# Patient Record
Sex: Female | Born: 1950 | Race: Black or African American | Hispanic: No | Smoking: Former smoker
Health system: Southern US, Community
[De-identification: ages and names within clinical notes are randomized; demographics above are authoritative.]

## PROBLEM LIST (undated history)

## (undated) DIAGNOSIS — K069 Disorder of gingiva and edentulous alveolar ridge, unspecified: Secondary | ICD-10-CM

## (undated) DIAGNOSIS — G709 Myoneural disorder, unspecified: Secondary | ICD-10-CM

## (undated) DIAGNOSIS — G2581 Restless legs syndrome: Secondary | ICD-10-CM

## (undated) DIAGNOSIS — M1990 Unspecified osteoarthritis, unspecified site: Secondary | ICD-10-CM

## (undated) DIAGNOSIS — I10 Essential (primary) hypertension: Secondary | ICD-10-CM

## (undated) DIAGNOSIS — F419 Anxiety disorder, unspecified: Secondary | ICD-10-CM

## (undated) HISTORY — PX: COLONOSCOPY: SHX174

## (undated) HISTORY — PX: TONSILLECTOMY: SUR1361

## (undated) HISTORY — PX: EYE SURGERY: SHX253

## (undated) HISTORY — PX: BREAST SURGERY: SHX581

---

## 1998-10-31 ENCOUNTER — Other Ambulatory Visit: Admission: RE | Admit: 1998-10-31 | Discharge: 1998-10-31 | Payer: Self-pay | Admitting: Obstetrics and Gynecology

## 2001-02-07 ENCOUNTER — Other Ambulatory Visit: Admission: RE | Admit: 2001-02-07 | Discharge: 2001-02-07 | Payer: Self-pay | Admitting: Obstetrics and Gynecology

## 2007-02-03 ENCOUNTER — Ambulatory Visit: Payer: Self-pay | Admitting: Orthopedic Surgery

## 2007-02-03 DIAGNOSIS — M19049 Primary osteoarthritis, unspecified hand: Secondary | ICD-10-CM | POA: Insufficient documentation

## 2007-02-03 DIAGNOSIS — M549 Dorsalgia, unspecified: Secondary | ICD-10-CM | POA: Insufficient documentation

## 2007-02-03 DIAGNOSIS — M25569 Pain in unspecified knee: Secondary | ICD-10-CM

## 2014-08-05 ENCOUNTER — Other Ambulatory Visit: Payer: Self-pay | Admitting: Obstetrics and Gynecology

## 2014-08-06 ENCOUNTER — Encounter (HOSPITAL_COMMUNITY)
Admission: RE | Admit: 2014-08-06 | Discharge: 2014-08-06 | Disposition: A | Discharge status: Home / Self Care | Payer: BC Managed Care – PPO | Source: Ambulatory Visit | Attending: Obstetrics and Gynecology | Admitting: Obstetrics and Gynecology

## 2014-08-06 ENCOUNTER — Other Ambulatory Visit: Payer: Self-pay

## 2014-08-06 ENCOUNTER — Encounter (HOSPITAL_COMMUNITY): Payer: Self-pay

## 2014-08-06 DIAGNOSIS — Z01818 Encounter for other preprocedural examination: Secondary | ICD-10-CM | POA: Diagnosis present

## 2014-08-06 HISTORY — DX: Anxiety disorder, unspecified: F41.9

## 2014-08-06 HISTORY — DX: Disorder of gingiva and edentulous alveolar ridge, unspecified: K06.9

## 2014-08-06 HISTORY — DX: Essential (primary) hypertension: I10

## 2014-08-06 HISTORY — DX: Myoneural disorder, unspecified: G70.9

## 2014-08-06 HISTORY — DX: Unspecified osteoarthritis, unspecified site: M19.90

## 2014-08-06 HISTORY — DX: Restless legs syndrome: G25.81

## 2014-08-06 LAB — BASIC METABOLIC PANEL
ANION GAP: 7 (ref 5–15)
BUN: 30 mg/dL — ABNORMAL HIGH (ref 6–20)
CALCIUM: 9.1 mg/dL (ref 8.9–10.3)
CO2: 28 mmol/L (ref 22–32)
Chloride: 104 mmol/L (ref 101–111)
Creatinine, Ser: 1.35 mg/dL — ABNORMAL HIGH (ref 0.44–1.00)
GFR, EST AFRICAN AMERICAN: 47 mL/min — AB (ref >60)
GFR, EST NON AFRICAN AMERICAN: 41 mL/min — AB (ref >60)
Glucose, Bld: 114 mg/dL — ABNORMAL HIGH (ref 65–99)
POTASSIUM: 3.9 mmol/L (ref 3.5–5.1)
SODIUM: 139 mmol/L (ref 135–145)

## 2014-08-06 LAB — CBC
HCT: 37.9 % (ref 36.0–46.0)
HEMOGLOBIN: 12.6 g/dL (ref 12.0–15.0)
MCH: 30.4 pg (ref 26.0–34.0)
MCHC: 33.2 g/dL (ref 30.0–36.0)
MCV: 91.3 fL (ref 78.0–100.0)
Platelets: 224 10*3/uL (ref 150–400)
RBC: 4.15 MIL/uL (ref 3.87–5.11)
RDW: 13 % (ref 11.5–15.5)
WBC: 5.3 10*3/uL (ref 4.0–10.5)

## 2014-08-06 NOTE — Patient Instructions (Addendum)
   Your procedure is scheduled on:  Monday, June 27  Enter through the Main Entrance of [facility] at: 11:30 AM Pick up the phone at the desk and dial [phone] and inform us of your arrival.  Please call this number if you have any problems the morning of surgery: [phone]  Remember: Do not eat food after midnight: Sunday Do not drink clear liquids after: 9 AM Monday, day of surgery Take these medicines the morning of surgery with a SIP OF WATER:  maxide and valium if needed  Do not wear jewelry, make-up, or FINGER nail polish No metal in your hair or on your body. Do not wear lotions, powders, perfumes.  You may wear deodorant.  Do not bring valuables to the hospital. Contacts, dentures or bridgework may not be worn into surgery.  Leave suitcase in the car. After Surgery it may be brought to your room. For patients being admitted to the hospital, checkout time is 11:00am the day of discharge.  Home with daughter [name] cell [phone]

## 2014-08-15 NOTE — H&P (Signed)
NAMEJOSALYN, [name]            ACCOUNT NO.:  [id number]  MEDICAL RECORD NO.:  [id number]  LOCATION:  [facility]                           FACILITY:  [facility]  PHYSICIAN:  Lovenia Kim, M.D.DATE OF BIRTH:  1950-09-08  DATE OF ADMISSION:  08/06/2014 DATE OF DISCHARGE:  08/06/2014                             HISTORY & PHYSICAL   PREOPERATIVE DIAGNOSIS:  Symptomatic pelvic prolapse.  HISTORY OF PRESENT ILLNESS:  A 64 year old African American female, G1, P1 with symptomatic pelvic organ prolapse with uterine prolapse, possible cystocele, rectocele and possible menorrhagia.  ALLERGIES:  She has allergies to codeine, penicillin, and sulfa.  SOCIAL HISTORY:  She is a nonsmoker, nondrinker.  She denies domestic physical violence.  She has a history of vaginal delivery x1.  SURGICAL HISTORY:  She has a surgical history remarkable for tonsillectomy.  MEDICATIONS:  Include meloxicam p.r.n., Zyrtec p.r.n.,HCTZ, Vistaril as needed, diazepam as needed, multivitamin, fish oil, and soy.  PHYSICAL EXAMINATION:  GENERAL:  She is a well-developed, well- nourished, African American female, in no acute distress. HEENT:  Normal. NECK:  Supple.  Full range of motion. LUNGS:  Clear. HEART:  Regular rate and rhythm. ABDOMEN:  Soft, nontender. PELVIC:  Bulky retroflexed uterus and no adnexal masses.  Grade 1-2 cervical prolapse, cervix noted at the os without Valsalva. EXTREMITIES:  Reveal no cords. NEUROLOGIC:  Nonfocal. SKIN:  Intact.  IMPRESSION:  Symptomatic uterine prolapse with no evidence of urinary incontinence.  PLAN:  Proceed with TVH, anterior-posterior colporrhaphy, possible enterocele repair, risks of anesthesia, infection, bleeding, injury to surrounding organs with possible need for repair was discussed, delayed versus immediate complications to include bowel and bladder injury discussed.  The patient acknowledges and wishes to proceed.  Please note, the patient had a normal   endometrial biopsy done preoperatively.     Lovenia Kim, M.D.     RJT/MEDQ  D:  08/15/2014  T:  08/15/2014  Job:  [id number]

## 2014-08-15 NOTE — H&P (Deleted)
[name], [name]            ACCOUNT NO.:  [id number]  MEDICAL RECORD NO.:  [id number]  LOCATION:  [place]                           FACILITY:  [facility]  PHYSICIAN:  Lovenia Kim, M.D.DATE OF BIRTH:  08-05-1950  DATE OF ADMISSION:  08/06/2014 DATE OF DISCHARGE:  08/06/2014                             HISTORY & PHYSICAL   PREOPERATIVE DIAGNOSIS:  Symptomatic pelvic prolapse.  HISTORY OF PRESENT ILLNESS:  A 64 year old African American female, G1, P1 with symptomatic pelvic organ prolapse with uterine prolapse, possible cystocele, rectocele and possible menorrhagia.  ALLERGIES:  She has allergies to codeine, penicillin, and sulfa.  SOCIAL HISTORY:  She is a nonsmoker, nondrinker.  She denies domestic physical violence.  She has a history of vaginal delivery x1.  SURGICAL HISTORY:  She has a surgical history remarkable for tonsillectomy.  MEDICATIONS:  Include meloxicam p.r.n., Zyrtec p.r.n.,HCTZ, Vistaril as needed, diazepam as needed, multivitamin, fish oil, and soy.  PHYSICAL EXAMINATION:  GENERAL:  She is a well-developed, well- nourished, African American female, in no acute distress. HEENT:  Normal. NECK:  Supple.  Full range of motion. LUNGS:  Clear. HEART:  Regular rate and rhythm. ABDOMEN:  Soft, nontender. PELVIC:  Bulky retroflexed uterus and no adnexal masses.  Grade 1-2 cervical prolapse, cervix noted at the os without Valsalva. EXTREMITIES:  Reveal no cords. NEUROLOGIC:  Nonfocal. SKIN:  Intact.  IMPRESSION:  Symptomatic uterine prolapse with no evidence of urinary incontinence.  PLAN:  Proceed with TVH, anterior-posterior colporrhaphy, possible enterocele repair, risks of anesthesia, infection, bleeding, injury to surrounding organs with possible need for repair was discussed, delayed versus immediate complications to include bowel and bladder injury discussed.  The patient acknowledges and wishes to proceed.  Please note, the patient had a normal   endometrial biopsy done preoperatively.     Lovenia Kim, M.D.     RJT/MEDQ  D:  08/15/2014  T:  08/15/2014  Job:  [id number]

## 2014-08-16 ENCOUNTER — Encounter (HOSPITAL_COMMUNITY)
Admission: RE | Disposition: A | Discharge status: Home / Self Care | Payer: Self-pay | Source: Ambulatory Visit | Attending: Obstetrics and Gynecology

## 2014-08-16 ENCOUNTER — Ambulatory Visit (HOSPITAL_COMMUNITY): Payer: BC Managed Care – PPO | Admitting: Anesthesiology

## 2014-08-16 ENCOUNTER — Ambulatory Visit (HOSPITAL_COMMUNITY)
Admission: RE | Admit: 2014-08-16 | Discharge: 2014-08-17 | Disposition: A | Discharge status: Home / Self Care | Payer: BC Managed Care – PPO | Source: Ambulatory Visit | Attending: Obstetrics and Gynecology | Admitting: Obstetrics and Gynecology

## 2014-08-16 ENCOUNTER — Encounter (HOSPITAL_COMMUNITY): Payer: Self-pay | Admitting: *Deleted

## 2014-08-16 DIAGNOSIS — N8189 Other female genital prolapse: Secondary | ICD-10-CM | POA: Diagnosis present

## 2014-08-16 DIAGNOSIS — F419 Anxiety disorder, unspecified: Secondary | ICD-10-CM | POA: Diagnosis not present

## 2014-08-16 DIAGNOSIS — D259 Leiomyoma of uterus, unspecified: Secondary | ICD-10-CM | POA: Diagnosis not present

## 2014-08-16 DIAGNOSIS — M1990 Unspecified osteoarthritis, unspecified site: Secondary | ICD-10-CM | POA: Insufficient documentation

## 2014-08-16 DIAGNOSIS — I10 Essential (primary) hypertension: Secondary | ICD-10-CM | POA: Insufficient documentation

## 2014-08-16 DIAGNOSIS — N814 Uterovaginal prolapse, unspecified: Secondary | ICD-10-CM | POA: Diagnosis not present

## 2014-08-16 DIAGNOSIS — Z87891 Personal history of nicotine dependence: Secondary | ICD-10-CM | POA: Insufficient documentation

## 2014-08-16 DIAGNOSIS — Z880 Allergy status to penicillin: Secondary | ICD-10-CM | POA: Diagnosis not present

## 2014-08-16 DIAGNOSIS — N840 Polyp of corpus uteri: Secondary | ICD-10-CM | POA: Diagnosis not present

## 2014-08-16 DIAGNOSIS — Z882 Allergy status to sulfonamides status: Secondary | ICD-10-CM | POA: Diagnosis not present

## 2014-08-16 HISTORY — PX: BILATERAL SALPINGECTOMY: SHX5743

## 2014-08-16 HISTORY — PX: VAGINAL HYSTERECTOMY: SHX2639

## 2014-08-16 HISTORY — PX: ANTERIOR AND POSTERIOR REPAIR: SHX5121

## 2014-08-16 SURGERY — HYSTERECTOMY, VAGINAL
Anesthesia: General

## 2014-08-16 MED ORDER — FENTANYL CITRATE (PF) 100 MCG/2ML IJ SOLN
INTRAMUSCULAR | Status: DC | PRN
  Administered 2014-08-16 (×5): 50 ug via INTRAVENOUS

## 2014-08-16 MED ORDER — SODIUM CHLORIDE 0.9 % IJ SOLN
INTRAMUSCULAR | Status: AC
  Filled 2014-08-16: qty 50

## 2014-08-16 MED ORDER — PROPOFOL 10 MG/ML IV BOLUS
INTRAVENOUS | Status: AC
  Filled 2014-08-16: qty 20

## 2014-08-16 MED ORDER — MIDAZOLAM HCL 2 MG/2ML IJ SOLN
INTRAMUSCULAR | Status: AC
  Filled 2014-08-16: qty 2

## 2014-08-16 MED ORDER — NALOXONE HCL 0.4 MG/ML IJ SOLN: 0.4000 mg | INTRAMUSCULAR | Status: DC | PRN

## 2014-08-16 MED ORDER — VASOPRESSIN 20 UNIT/ML IV SOLN
INTRAVENOUS | Status: AC
  Filled 2014-08-16: qty 1

## 2014-08-16 MED ORDER — HYDROMORPHONE 0.3 MG/ML IV SOLN
INTRAVENOUS | Status: DC
  Administered 2014-08-16: 18:00:00 via INTRAVENOUS
  Administered 2014-08-16: 1.13 mg via INTRAVENOUS
  Administered 2014-08-17: 0.399 mg via INTRAVENOUS
  Filled 2014-08-16: qty 25

## 2014-08-16 MED ORDER — DEXTROSE IN LACTATED RINGERS 5 % IV SOLN
INTRAVENOUS | Status: DC
  Administered 2014-08-16: 23:00:00 via INTRAVENOUS

## 2014-08-16 MED ORDER — ESTRADIOL 0.1 MG/GM VA CREA
TOPICAL_CREAM | VAGINAL | Status: AC
  Filled 2014-08-16: qty 42.5

## 2014-08-16 MED ORDER — ONDANSETRON HCL 4 MG/2ML IJ SOLN: 4.0000 mg | Freq: Four times a day (QID) | INTRAMUSCULAR | Status: DC | PRN

## 2014-08-16 MED ORDER — HYDROMORPHONE HCL 1 MG/ML IJ SOLN
INTRAMUSCULAR | Status: DC | PRN
  Administered 2014-08-16: 1 mg via INTRAVENOUS

## 2014-08-16 MED ORDER — DIPHENHYDRAMINE HCL 12.5 MG/5ML PO ELIX: 12.5000 mg | ORAL_SOLUTION | Freq: Four times a day (QID) | ORAL | Status: DC | PRN

## 2014-08-16 MED ORDER — FENTANYL CITRATE (PF) 100 MCG/2ML IJ SOLN
25.0000 ug | INTRAMUSCULAR | Status: DC | PRN
  Administered 2014-08-16 (×3): 50 ug via INTRAVENOUS

## 2014-08-16 MED ORDER — DEXAMETHASONE SODIUM PHOSPHATE 10 MG/ML IJ SOLN
INTRAMUSCULAR | Status: DC | PRN
Start: 2014-08-16 — End: 2014-08-16
  Administered 2014-08-16: 10 mg via INTRAVENOUS

## 2014-08-16 MED ORDER — HYDROMORPHONE HCL 1 MG/ML IJ SOLN
INTRAMUSCULAR | Status: AC
Start: 2014-08-16 — End: 2014-08-16
  Filled 2014-08-16: qty 1

## 2014-08-16 MED ORDER — LIDOCAINE HCL (CARDIAC) 20 MG/ML IV SOLN
INTRAVENOUS | Status: DC | PRN
  Administered 2014-08-16: 60 mg via INTRAVENOUS

## 2014-08-16 MED ORDER — ONDANSETRON HCL 4 MG/2ML IJ SOLN
INTRAMUSCULAR | Status: AC
Start: 2014-08-16 — End: 2014-08-16
  Filled 2014-08-16: qty 2

## 2014-08-16 MED ORDER — OXYCODONE-ACETAMINOPHEN 5-325 MG PO TABS: 1.0000 | ORAL_TABLET | ORAL | Status: DC | PRN

## 2014-08-16 MED ORDER — DIPHENHYDRAMINE HCL 50 MG/ML IJ SOLN: 12.5000 mg | Freq: Four times a day (QID) | INTRAMUSCULAR | Status: DC | PRN

## 2014-08-16 MED ORDER — LACTATED RINGERS IV SOLN
INTRAVENOUS | Status: DC
  Administered 2014-08-16 (×3): via INTRAVENOUS

## 2014-08-16 MED ORDER — FENTANYL CITRATE (PF) 100 MCG/2ML IJ SOLN
INTRAMUSCULAR | Status: AC
  Filled 2014-08-16: qty 2

## 2014-08-16 MED ORDER — VASOPRESSIN 20 UNIT/ML IV SOLN
INTRAVENOUS | Status: DC | PRN
  Administered 2014-08-16: 20 mL via INTRAMUSCULAR

## 2014-08-16 MED ORDER — TRIAMTERENE-HCTZ 37.5-25 MG PO TABS
1.0000 | ORAL_TABLET | Freq: Every day | ORAL | Status: DC
  Filled 2014-08-16: qty 1

## 2014-08-16 MED ORDER — CEFAZOLIN SODIUM-DEXTROSE 2-3 GM-% IV SOLR
2.0000 g | INTRAVENOUS | Status: AC
  Administered 2014-08-16: 2 g via INTRAVENOUS

## 2014-08-16 MED ORDER — CEFAZOLIN SODIUM-DEXTROSE 2-3 GM-% IV SOLR
INTRAVENOUS | Status: AC
  Filled 2014-08-16: qty 50

## 2014-08-16 MED ORDER — ESTRADIOL 0.1 MG/GM VA CREA
TOPICAL_CREAM | VAGINAL | Status: DC | PRN
  Administered 2014-08-16: 1 via VAGINAL

## 2014-08-16 MED ORDER — TRAMADOL HCL 50 MG PO TABS: 50.0000 mg | ORAL_TABLET | Freq: Four times a day (QID) | ORAL | Status: DC | PRN

## 2014-08-16 MED ORDER — ACETAMINOPHEN 10 MG/ML IV SOLN
1000.0000 mg | Freq: Once | INTRAVENOUS | Status: AC
  Administered 2014-08-16: 1000 mg via INTRAVENOUS
  Filled 2014-08-16: qty 100

## 2014-08-16 MED ORDER — GLYCOPYRROLATE 0.2 MG/ML IJ SOLN
INTRAMUSCULAR | Status: DC | PRN
  Administered 2014-08-16: .4 mg via INTRAVENOUS
  Administered 2014-08-16 (×2): .1 mg via INTRAVENOUS

## 2014-08-16 MED ORDER — LIDOCAINE HCL (CARDIAC) 20 MG/ML IV SOLN
INTRAVENOUS | Status: AC
  Filled 2014-08-16: qty 5

## 2014-08-16 MED ORDER — MIDAZOLAM HCL 2 MG/2ML IJ SOLN
INTRAMUSCULAR | Status: DC | PRN
  Administered 2014-08-16: .5 mg via INTRAVENOUS
  Administered 2014-08-16: 1.5 mg via INTRAVENOUS

## 2014-08-16 MED ORDER — ONDANSETRON HCL 4 MG/2ML IJ SOLN: 4.0000 mg | Freq: Once | INTRAMUSCULAR | Status: DC | PRN

## 2014-08-16 MED ORDER — GLYCOPYRROLATE 0.2 MG/ML IJ SOLN
INTRAMUSCULAR | Status: AC
  Filled 2014-08-16: qty 3

## 2014-08-16 MED ORDER — SODIUM CHLORIDE 0.9 % IJ SOLN: 9.0000 mL | INTRAMUSCULAR | Status: DC | PRN

## 2014-08-16 MED ORDER — ROCURONIUM BROMIDE 100 MG/10ML IV SOLN
INTRAVENOUS | Status: DC | PRN
  Administered 2014-08-16: 40 mg via INTRAVENOUS

## 2014-08-16 MED ORDER — LABETALOL HCL 5 MG/ML IV SOLN
INTRAVENOUS | Status: DC | PRN
  Administered 2014-08-16: 10 mg via INTRAVENOUS

## 2014-08-16 MED ORDER — DEXAMETHASONE SODIUM PHOSPHATE 10 MG/ML IJ SOLN
INTRAMUSCULAR | Status: AC
  Filled 2014-08-16: qty 1

## 2014-08-16 MED ORDER — ROCURONIUM BROMIDE 100 MG/10ML IV SOLN
INTRAVENOUS | Status: AC
  Filled 2014-08-16: qty 1

## 2014-08-16 MED ORDER — LABETALOL HCL 5 MG/ML IV SOLN
INTRAVENOUS | Status: AC
Start: 2014-08-16 — End: 2014-08-16
  Filled 2014-08-16: qty 4

## 2014-08-16 MED ORDER — NEOSTIGMINE METHYLSULFATE 10 MG/10ML IV SOLN
INTRAVENOUS | Status: DC | PRN
  Administered 2014-08-16: 3 mg via INTRAVENOUS

## 2014-08-16 MED ORDER — ONDANSETRON HCL 4 MG/2ML IJ SOLN
INTRAMUSCULAR | Status: DC | PRN
  Administered 2014-08-16 (×2): 2 mg via INTRAVENOUS

## 2014-08-16 MED ORDER — PROPOFOL 10 MG/ML IV BOLUS
INTRAVENOUS | Status: DC | PRN
  Administered 2014-08-16: 140 mg via INTRAVENOUS

## 2014-08-16 MED ORDER — FENTANYL CITRATE (PF) 250 MCG/5ML IJ SOLN
INTRAMUSCULAR | Status: AC
  Filled 2014-08-16: qty 5

## 2014-08-16 SURGICAL SUPPLY — 30 items
CANISTER SUCT 3000ML (MISCELLANEOUS) ×3 IMPLANT
CLOTH BEACON ORANGE TIMEOUT ST (SAFETY) ×3 IMPLANT
CONT PATH 16OZ SNAP LID 3702 (MISCELLANEOUS) ×1 IMPLANT
CONTAINER PREFILL 10% NBF 60ML (FORM) IMPLANT
DECANTER SPIKE VIAL GLASS SM (MISCELLANEOUS) ×3 IMPLANT
DRSG TELFA 3X8 NADH (GAUZE/BANDAGES/DRESSINGS) ×3 IMPLANT
ELECT LIGASURE SHORT 9 REUSE (ELECTRODE) ×1 IMPLANT
GAUZE PACKING 1 X5 YD ST (GAUZE/BANDAGES/DRESSINGS) ×1 IMPLANT
GLOVE BIO SURGEON STRL SZ7.5 (GLOVE) ×6 IMPLANT
GLOVE BIOGEL PI IND STRL 6.5 (GLOVE) ×2 IMPLANT
GLOVE BIOGEL PI INDICATOR 6.5 (GLOVE) ×1
GOWN STRL REUS W/TWL LRG LVL3 (GOWN DISPOSABLE) ×15 IMPLANT
NDL SPNL 22GX3.5 QUINCKE BK (NEEDLE) IMPLANT
NEEDLE HYPO 22GX1.5 SAFETY (NEEDLE) ×1 IMPLANT
NEEDLE SPNL 22GX3.5 QUINCKE BK (NEEDLE) ×3 IMPLANT
NS IRRIG 1000ML POUR BTL (IV SOLUTION) ×3 IMPLANT
PACK VAGINAL WOMENS (CUSTOM PROCEDURE TRAY) ×3 IMPLANT
PAD DRESSING TELFA 3X8 NADH (GAUZE/BANDAGES/DRESSINGS) ×2 IMPLANT
PAD OB MATERNITY 4.3X12.25 (PERSONAL CARE ITEMS) ×3 IMPLANT
SUT VIC AB 0 CT1 18XCR BRD8 (SUTURE) ×6 IMPLANT
SUT VIC AB 0 CT1 27 (SUTURE) ×6
SUT VIC AB 0 CT1 27XBRD ANBCTR (SUTURE) ×4 IMPLANT
SUT VIC AB 0 CT1 8-18 (SUTURE) ×9
SUT VIC AB 2-0 CT1 27 (SUTURE) ×3
SUT VIC AB 2-0 CT1 TAPERPNT 27 (SUTURE) IMPLANT
SUT VIC AB 2-0 SH 27 (SUTURE) ×3
SUT VIC AB 2-0 SH 27XBRD (SUTURE) IMPLANT
TOWEL OR 17X24 6PK STRL BLUE (TOWEL DISPOSABLE) ×6 IMPLANT
TRAY FOLEY CATH SILVER 14FR (SET/KITS/TRAYS/PACK) ×3 IMPLANT
WATER STERILE IRR 1000ML POUR (IV SOLUTION) ×3 IMPLANT

## 2014-08-16 NOTE — Op Note (Signed)
08/16/2014  3:21 PM  PATIENT:  [name]  64 y.o. female  PRE-OPERATIVE DIAGNOSIS:  Uterine Prolapse, Cystocele, Rectocele  POST-OPERATIVE DIAGNOSIS:  Uterine Prolapse, Cystocele, Rectocele, Enterocele, Pelvic adhesions  PROCEDURE:  Procedure(s): HYSTERECTOMY VAGINAL BILATERAL SALPINGECTOMY ANTERIOR (CYSTOCELE) AND POSTERIOR REPAIR (RECTOCELE)  ENTEROCELE REPAIR MCCALL CUL DE PLASTY LYSIS OF ADHESIONS PERINEORRAPHY  SURGEON:  Surgeon(s): Brien Few, MD Azucena Fallen, MD  ASSISTANTS: MODY, MD   ANESTHESIA:   general  ESTIMATED BLOOD LOSS: * No blood loss amount entered *   DRAINS: Urinary Catheter (Foley)   LOCAL MEDICATIONS USED:  NONE  SPECIMEN:  Source of Specimen:  UTERUS, CERVIX, PARTIAL TUBES,   DISPOSITION OF SPECIMEN:  PATHOLOGY  COUNTS:  YES  DICTATION #: [id number]  PLAN OF CARE: DC AM  PATIENT DISPOSITION:  PACU - hemodynamically stable.

## 2014-08-16 NOTE — Anesthesia Procedure Notes (Signed)
Procedure Name: Intubation Date/Time: 08/16/2014 1:25 PM Performed by: Mayer Camel, Vanessia Bokhari A Pre-anesthesia Checklist: Patient identified, Patient being monitored, Emergency Drugs available and Timeout performed Patient Re-evaluated:Patient Re-evaluated prior to inductionOxygen Delivery Method: Circle system utilized Preoxygenation: Pre-oxygenation with 100% oxygen Intubation Type: IV induction Ventilation: Mask ventilation without difficulty Laryngoscope Size: Miller and 2 Grade View: Grade I Tube type: Oral Tube size: 7.0 mm Number of attempts: 1 Placement Confirmation: ETT inserted through vocal cords under direct vision,  positive ETCO2 and breath sounds checked- equal and bilateral Secured at: 22 cm Tube secured with: Tape Dental Injury: Teeth and Oropharynx as per pre-operative assessment

## 2014-08-16 NOTE — Anesthesia Postprocedure Evaluation (Signed)
  Anesthesia Post-op Note  Patient: [name]  Procedure(s) Performed: Procedure(s) with comments: HYSTERECTOMY VAGINAL (N/A) - 2 hrs. BILATERAL SALPINGECTOMY (Bilateral) ANTERIOR (CYSTOCELE) AND POSTERIOR REPAIR (RECTOCELE)  (N/A)  Patient Location: PACU  Anesthesia Type:General  Level of Consciousness: awake, alert  and oriented  Airway and Oxygen Therapy: Patient Spontanous Breathing  Post-op Pain: mild  Post-op Assessment: Post-op Vital signs reviewed, Patient's Cardiovascular Status Stable, Respiratory Function Stable, Patent Airway, No signs of Nausea or vomiting and Pain level controlled              Post-op Vital Signs: Reviewed and stable  Last Vitals:  Filed Vitals:   08/16/14 1600  BP:   Pulse: 58  Temp:   Resp: 16    Complications: No apparent anesthesia complications

## 2014-08-16 NOTE — Transfer of Care (Signed)
Immediate Anesthesia Transfer of Care Note  Patient: Maurianna Benard  Procedure(s) Performed: Procedure(s) with comments: HYSTERECTOMY VAGINAL (N/A) - 2 hrs. BILATERAL SALPINGECTOMY (Bilateral) ANTERIOR (CYSTOCELE) AND POSTERIOR REPAIR (RECTOCELE)  (N/A)  Patient Location: PACU  Anesthesia Type:General  Level of Consciousness: awake, alert  and oriented  Airway & Oxygen Therapy: Patient Spontanous Breathing and Patient connected to nasal cannula oxygen  Post-op Assessment: Report given to RN and Post -op Vital signs reviewed and stable  Post vital signs: Reviewed and stable  Last Vitals:  Filed Vitals:   08/16/14 1143  Pulse: 65  Temp: 36.9 C  Resp: 18    Complications: No apparent anesthesia complications

## 2014-08-16 NOTE — Anesthesia Preprocedure Evaluation (Signed)
Anesthesia Evaluation  Patient identified by MRN, date of birth, ID band Patient awake    Reviewed: Allergy & Precautions, NPO status , Patient's Chart, lab work & pertinent test results  History of Anesthesia Complications Negative for: history of anesthetic complications  Airway Mallampati: II  TM Distance: >3 FB Neck ROM: Full    Dental no notable dental hx. (+) Dental Advisory Given   Pulmonary former smoker,  breath sounds clear to auscultation  Pulmonary exam normal       Cardiovascular hypertension, Pt. on medications Normal cardiovascular examRhythm:Regular Rate:Normal     Neuro/Psych PSYCHIATRIC DISORDERS Anxiety negative neurological ROS     GI/Hepatic negative GI ROS, Neg liver ROS,   Endo/Other  negative endocrine ROS  Renal/GU negative Renal ROS  negative genitourinary   Musculoskeletal  (+) Arthritis -, sciatica   Abdominal   Peds negative pediatric ROS (+)  Hematology negative hematology ROS (+)   Anesthesia Other Findings   Reproductive/Obstetrics negative OB ROS                             Anesthesia Physical Anesthesia Plan  ASA: II  Anesthesia Plan: General   Post-op Pain Management:    Induction: Intravenous  Airway Management Planned: Oral ETT  Additional Equipment:   Intra-op Plan:   Post-operative Plan: Extubation in OR  Informed Consent: I have reviewed the patients History and Physical, chart, labs and discussed the procedure including the risks, benefits and alternatives for the proposed anesthesia with the patient or authorized representative who has indicated his/her understanding and acceptance.   Dental advisory given  Plan Discussed with: CRNA  Anesthesia Plan Comments:         Anesthesia Quick Evaluation

## 2014-08-16 NOTE — Progress Notes (Signed)
Patient ID: [name], female   DOB: Jan 02, 1951, 64 y.o.   MRN: [id number] Patient seen and examined. Consent witnessed and signed. No changes noted. Update completed.

## 2014-08-17 ENCOUNTER — Encounter (HOSPITAL_COMMUNITY): Payer: Self-pay | Admitting: Obstetrics and Gynecology

## 2014-08-17 DIAGNOSIS — N8189 Other female genital prolapse: Secondary | ICD-10-CM | POA: Diagnosis not present

## 2014-08-17 LAB — BASIC METABOLIC PANEL
ANION GAP: 6 (ref 5–15)
BUN: 18 mg/dL (ref 6–20)
CALCIUM: 8.7 mg/dL — AB (ref 8.9–10.3)
CO2: 27 mmol/L (ref 22–32)
Chloride: 103 mmol/L (ref 101–111)
Creatinine, Ser: 1.07 mg/dL — ABNORMAL HIGH (ref 0.44–1.00)
GFR, EST NON AFRICAN AMERICAN: 54 mL/min — AB (ref >60)
Glucose, Bld: 170 mg/dL — ABNORMAL HIGH (ref 65–99)
Potassium: 4.1 mmol/L (ref 3.5–5.1)
SODIUM: 136 mmol/L (ref 135–145)

## 2014-08-17 LAB — CBC
HCT: 34.8 % — ABNORMAL LOW (ref 36.0–46.0)
Hemoglobin: 11.6 g/dL — ABNORMAL LOW (ref 12.0–15.0)
MCH: 30.4 pg (ref 26.0–34.0)
MCHC: 33.3 g/dL (ref 30.0–36.0)
MCV: 91.3 fL (ref 78.0–100.0)
PLATELETS: 208 10*3/uL (ref 150–400)
RBC: 3.81 MIL/uL — AB (ref 3.87–5.11)
RDW: 13.2 % (ref 11.5–15.5)
WBC: 13.1 10*3/uL — ABNORMAL HIGH (ref 4.0–10.5)

## 2014-08-17 MED ORDER — OXYCODONE-ACETAMINOPHEN 5-325 MG PO TABS
1.0000 | ORAL_TABLET | ORAL | Status: DC | PRN
Start: 2014-08-17 — End: 2017-07-29

## 2014-08-17 MED ORDER — TRAMADOL HCL 50 MG PO TABS: 50.0000 mg | ORAL_TABLET | Freq: Four times a day (QID) | ORAL | Status: AC | PRN

## 2014-08-17 NOTE — Op Note (Signed)
[name], CONSOLO            ACCOUNT NO.:  [id number]  MEDICAL RECORD NO.:  [id number]  LOCATION:  9320                          FACILITY:  [facility]  PHYSICIAN:  Lovenia Kim, M.D.DATE OF BIRTH:  [birth year]  DATE OF PROCEDURE: DATE OF DISCHARGE:                              OPERATIVE REPORT   PREOPERATIVE DIAGNOSES:  Symptomatic pelvic prolapse with uterine prolapse, cystocele, rectocele.  POSTOPERATIVE DIAGNOSES:  Symptomatic pelvic prolapse with uterine prolapse, cystocele, rectocele, pelvic adhesions and enterocele.  PROCEDURES:  Total vaginal hysterectomy, lysis of adhesions, anterior colporrhaphy, posterior colporrhaphy, enterocele repair, McCall culdoplasty, perineorrhaphy.  SURGEON:  Lovenia Kim, M.D.  ASSISTANT:  Vilma Prader, MD  ANESTHESIA:  General.  ESTIMATED BLOOD LOSS:  Approximately 50 mL.  COMPLICATIONS:  None.  DRAINS:  Foley.  COUNTS:  Correct.  DISPOSITION:  The patient to recovery in good condition.  BRIEF OPERATIVE NOTE:  After being apprised of risks of anesthesia, infection, bleeding, injury to surrounding organs, possible need for repair, delayed versus immediate complications to include bowel and bladder injury, possible need for repair, the patient was brought to the operating room where she was administered a general anesthetic without complications, prepped and draped in usual sterile fashion.  Foley catheter placed.  Exam under anesthesia revealed prolapse of the uterine, cervix, extending past the hymen.  No adnexal masses were appreciated the bulky retroflexed uterus with fibroids as previously noted.  At this time, weighted speculum was placed.  Cervix was grasped using a Jacobs tenaculum and the cervicovaginal junction was infiltrated using a dilute vasopressin solution.  This junction scored circumferentially using bipolar cautery.  The sharp entry in the posterior cul-de-sac was made and a long weighted speculum was  placed in the anterior cul-de-sac.  Anterior vaginal mucosa was undermined pushing the bladder up during the process sharply and atraumatically.  Anterior cul-de-sac entry not made at this time.  The uterosacral ligaments were bilaterally clamped and suture ligated, transfixed to the vaginal cuff. At this time, the cardinal ligament bites were taken bilaterally. Anterior cul-de-sac entry was made sharply, the Deaver retractor was placed.  At this time, the uterine vessels were bilaterally clamped and suture ligated.  Ligature was used to take progressive bites up the broad ligament to the level of the tubo-ovarian round ligament complex, this was clamped on the right side and the specimen was partially detached, these pedicles were transfixed using a 0 plain tie and suture of partial salpingectomy being included within the process, ovary feels normal on the right.  Similar procedure done on the left side with partial salpingectomy proceed as well.  The specimen was removed in total.  At the fundus, there were noted some adhesions to the bowel, which were lysed sharply under direct visualization, allowing complete enucleation of the specimen.  At this time, McCall culdoplasty sutures were placed, internal and external, good hemostasis noted and all pedicles were inspected.  The McCall culdoplasty sutures were tied and the uterosacral ligaments were plicated in the midline.  The vagina was closed using 0 Vicryl suture in an interrupted figure-of-eight fashion. Anterior vaginal wall was then further undermined for the cystocele repair.  Cystocele was reduced sharply and bluntly and imbricated using  a 2-0 Vicryl suture.  The vaginal cuff was trimmed and the vagina was closed using 0 Vicryl suture in interrupted fashion.  Posteriorly, a triangular perineal incision was made to the posterior fourchette.  The cephalad portion of the rectocele was identified via rectal exam and the posterior  vaginal wall mucosa was undermined.  Rectocele was reduced, imbricated using multiple 0 Vicryl sutures.  The vaginal mucosa trimmed and rectocele performed using 0 Vicryl suture.  Perineorrhaphy performed in a standard fashion using a 2-0 Vicryl suture.  Good hemostasis noted. All instruments were removed.  Urine was clear.  The patient tolerated the procedure well, was awakened and transferred to recovery after placement of vaginal packing and she was transferred to recovery in good condition.     Lovenia Kim, M.D.     RJT/MEDQ  D:  08/16/2014  T:  08/17/2014  Job:  [id number]

## 2014-08-17 NOTE — Progress Notes (Signed)
Pt. Is discharged in the care of daugther.with N.T. Escort. Denies pain or discomfort. Spirits are good. Understands all instructions well Questions asked and answered. No equipment needed for home use. Scanty amt of vaginal bleeding noted on V-Pad.

## 2014-08-17 NOTE — Progress Notes (Signed)
1 Day Post-Op Procedure(s) (LRB): HYSTERECTOMY VAGINAL (N/A) BILATERAL SALPINGECTOMY (Bilateral) ANTERIOR (CYSTOCELE) AND POSTERIOR REPAIR (RECTOCELE)  (N/A)  Subjective: Patient reports nausea, tolerating PO, + flatus and no problems voiding.    Objective: BP 146/70 mmHg  Pulse 94  Temp(Src) 98.5 F (36.9 C) (Oral)  Resp 16  Ht 5\' 6"  (1.676 m)  Wt 81.647 kg (180 lb)  BMI 29.07 kg/m2  SpO2 100%  CBC    Component Value Date/Time   WBC 13.1* 08/17/2014 0531   RBC 3.81* 08/17/2014 0531   HGB 11.6* 08/17/2014 0531   HCT 34.8* 08/17/2014 0531   PLT 208 08/17/2014 0531   MCV 91.3 08/17/2014 0531   MCH 30.4 08/17/2014 0531   MCHC 33.3 08/17/2014 0531   RDW 13.2 08/17/2014 0531  .bm  I have reviewed patient's vital signs, medications and labs.  General: alert, cooperative and appears stated age Resp: clear to auscultation bilaterally and normal percussion bilaterally Cardio: regular rate and rhythm, S1, S2 normal, no murmur, click, rub or gallop and normal apical impulse GI: soft, non-tender; bowel sounds normal; no masses,  no organomegaly, normal findings: aorta normal and bowel sounds normal and incision: na Extremities: extremities normal, atraumatic, no cyanosis or edema, Homans sign is negative, no sign of DVT and no edema, redness or tenderness in the calves or thighs Vaginal Bleeding: minimal  Assessment: s/p Procedure(s) with comments: HYSTERECTOMY VAGINAL (N/A) - 2 hrs. BILATERAL SALPINGECTOMY (Bilateral) ANTERIOR (CYSTOCELE) AND POSTERIOR REPAIR (RECTOCELE)  (N/A): stable, progressing well and tolerating diet  Plan: Advance diet Encourage ambulation Advance to PO medication Discontinue IV fluids Discharge home     Kyndal Gloster J 08/17/2014, 7:04 AM

## 2014-08-17 NOTE — Addendum Note (Signed)
Addendum  created 08/17/14 1028 by Elenore Paddy, CRNA   Modules edited: Notes Section   Notes Section:  File: [id number]

## 2014-08-17 NOTE — Anesthesia Postprocedure Evaluation (Signed)
  Anesthesia Post-op Note  Patient: [name]  Procedure(s) Performed: Procedure(s) with comments: HYSTERECTOMY VAGINAL (N/A) - 2 hrs. BILATERAL SALPINGECTOMY (Bilateral) ANTERIOR (CYSTOCELE) AND POSTERIOR REPAIR (RECTOCELE)  (N/A)  Patient Location: PACU and Women's Unit  Anesthesia Type:General  Level of Consciousness: awake, alert  and oriented  Airway and Oxygen Therapy: Patient Spontanous Breathing  Post-op Pain: mild  Post-op Assessment: Patient's Cardiovascular Status Stable, Respiratory Function Stable, Patent Airway, No signs of Nausea or vomiting, Adequate PO intake and Pain level controlled              Post-op Vital Signs: Reviewed and stable  Last Vitals:  Filed Vitals:   08/17/14 0609  BP: 146/70  Pulse: 94  Temp: 36.9 C  Resp: 16    Complications: No apparent anesthesia complications

## 2014-08-17 NOTE — Progress Notes (Addendum)
Admission nutrition screen triggered for unintentional weight loss > 10 lbs within the last month. Chart indicates a 10 lb weight loss in the  past 11 days. If pt is not tolerating diet or has poor appetite, please order Ensure TID to supplement meals.  Weyman Rodney M.Fredderick Severance LDN Neonatal Nutrition Support Specialist/RD III Pager [phone]      Phone [phone]

## 2017-07-29 ENCOUNTER — Ambulatory Visit (INDEPENDENT_AMBULATORY_CARE_PROVIDER_SITE_OTHER): Payer: BC Managed Care – PPO

## 2017-07-29 ENCOUNTER — Encounter: Payer: Self-pay | Admitting: Orthopedic Surgery

## 2017-07-29 ENCOUNTER — Ambulatory Visit: Payer: BC Managed Care – PPO | Admitting: Orthopedic Surgery

## 2017-07-29 VITALS — BP 149/80 | HR 72 | Ht 64.0 in | Wt 182.0 lb

## 2017-07-29 DIAGNOSIS — M5440 Lumbago with sciatica, unspecified side: Secondary | ICD-10-CM

## 2017-07-29 DIAGNOSIS — M1712 Unilateral primary osteoarthritis, left knee: Secondary | ICD-10-CM | POA: Diagnosis not present

## 2017-07-29 DIAGNOSIS — M4317 Spondylolisthesis, lumbosacral region: Secondary | ICD-10-CM | POA: Diagnosis not present

## 2017-07-29 DIAGNOSIS — G8929 Other chronic pain: Secondary | ICD-10-CM | POA: Diagnosis not present

## 2017-07-29 DIAGNOSIS — M25562 Pain in left knee: Secondary | ICD-10-CM | POA: Diagnosis not present

## 2017-07-29 MED ORDER — PREDNISONE 10 MG (48) PO TBPK: ORAL_TABLET | Freq: Every day | ORAL | 3 refills | Status: DC

## 2017-07-29 MED ORDER — PREDNISONE 10 MG (48) PO TBPK: ORAL_TABLET | Freq: Every day | ORAL | 3 refills | Status: AC

## 2017-07-29 NOTE — Addendum Note (Signed)
Addended byCandice Camp on: 07/29/2017 09:53 AM   Modules accepted: Orders

## 2017-07-29 NOTE — Patient Instructions (Signed)
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

## 2017-07-29 NOTE — Addendum Note (Signed)
Addended by: Carole Civil on: 07/29/2017 09:54 AM   Modules accepted: Orders

## 2017-07-29 NOTE — Progress Notes (Signed)
NEW PATIENT OFFICE VISIT   Chief Complaint  Patient presents with  . Knee Pain    Left knee pain, and bilateral leg pain and back pain.     MEDICAL DECISION SECTION  xrays ordered? Yes   My independent reading of xrays:  X-ray report lumbar spine  Patient complains of leg hip and knee pain and back pain  Spot film shows degenerative facet arthritis at L4-5 L5-S1 spondylolisthesis of L5 5 on S1.  Disc space narrowing at L5-S1  Probable reactive scoliosis left hemipelvis higher than right  Impression   grade 1 spondylolisthesis L5 on S1  Degenerative disc disease L5-S1  Facet arthritis L4-S1 X-ray report  Pain left knee  3 views left knee  Overall alignment is valgus  Medial compartment joint space narrowing subchondral sclerosis small femoral osteophyte  Tibial spines peaking  Lateral compartment no joint space narrowing sclerosis cyst formation or osteophyte formation or graft    patellofemoral joint symmetric joint spaces mild subtle degeneration small cysts are noted peripheral osteophyte is noted laterally.   Lateral x-ray shows normal posterior compartment joint space narrowing medial femoral condyle is noted as well.  Patellofemoral joint arthritis is seen  Impression   normal alignment valgus,  Moderate arthritis medial compartment  Mild arthritis patellofemoral compartment   Encounter Diagnoses  Name Primary?  . Primary osteoarthritis of left knee Yes  . Chronic bilateral low back pain with sciatica, sciatica laterality unspecified   . Spondylolisthesis at L5-S1 level      PLAN:  Left knee injection  Prednisone Dosepak  Physical therapy for lumbar spine  Left knee osteoarthritis brace  40-month follow-up     INJECTION  Yes Procedure note left knee injection verbal consent was obtained to inject left knee joint  Timeout was completed to confirm the site of injection  The medications used were 40 mg of Depo-Medrol and 1%  lidocaine 3 cc  Anesthesia was provided by ethyl chloride and the skin was prepped with alcohol.  After cleaning the skin with alcohol a 20-gauge needle was used to inject the left knee joint. There were no complications. A sterile bandage was applied.    Meds ordered this encounter  Medications  . predniSONE (STERAPRED UNI-PAK 48 TAB) 10 MG (48) TBPK tablet    Sig: Take by mouth daily. 10 mg 12-day Dosepak    Dispense:  48 tablet    Refill:  3    FURTHER WORK UP PLANNED no     Chief Complaint  Patient presents with  . Knee Pain    Left knee pain, and bilateral leg pain and back pain.    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  She was having some left knee pain for which she was taking meloxicam and intermittent prednisone with good relief  Her lower back pain and leg pain have not been treated with anything other than the meloxicam and then some topical medications that she rubbed on her back and knee  She is anxious to continue to work continue to wear heels to various church events and is concerned about having any surgery at this time.  She does not have any bowel or bladder dysfunction at this time  She has had no trauma to the knee or back   Review of Systems  Constitutional: Negative for chills, fever and weight loss.  Respiratory: Negative for shortness of breath.   Cardiovascular: Negative for chest pain.  Neurological: Negative for tingling.  Psychiatric/Behavioral: Insomnia: .all.      Past Medical History:  Diagnosis Date  . Anxiety   . Arthritis    knees, hands  . Chronic gum disease   . Hypertension   . Neuromuscular disorder    sciatica  . Restless legs   . SVD (spontaneous  vaginal delivery)    x 1    Past Surgical History:  Procedure Laterality Date  . ANTERIOR AND POSTERIOR REPAIR N/A 08/16/2014   Procedure: ANTERIOR (CYSTOCELE) AND POSTERIOR REPAIR (RECTOCELE) ;  Surgeon: Brien Few, MD;  Location: [facility];  Service: Gynecology;  Laterality: N/A;  . BILATERAL SALPINGECTOMY Bilateral 08/16/2014   Procedure: BILATERAL SALPINGECTOMY;  Surgeon: Brien Few, MD;  Location: [facility];  Service: Gynecology;  Laterality: Bilateral;  . BREAST SURGERY     right side I & D  . COLONOSCOPY    . EYE SURGERY     lasik - bilateral  . TONSILLECTOMY    . VAGINAL HYSTERECTOMY N/A 08/16/2014   Procedure: HYSTERECTOMY VAGINAL;  Surgeon: Brien Few, MD;  Location: [facility];  Service: Gynecology;  Laterality: N/A;  2 hrs.    No family history on file. Social History   Tobacco Use  . Smoking status: Former Smoker    Packs/day: 0.10    Types: Cigarettes  . Smokeless tobacco: Never Used  Substance Use Topics  . Alcohol use: No  . Drug use: No   Allergies  Allergen Reactions  . Penicillins Hives  . Sulfonamide Derivatives Rash     No outpatient medications have been marked as taking for the 07/29/17 encounter (Office Visit) with Carole Civil, MD.    BP (!) 149/80   Pulse 72   Ht 5\' 4"  (1.626 m)   Wt 182 lb (82.6 kg)   BMI 31.24 kg/m   Physical Exam  Constitutional: She is oriented to person, place, and time. She appears well-developed and well-nourished.  Neurological: She is alert and oriented to person, place, and time.  Psychiatric: She has a normal mood and affect. Judgment normal.  Vitals reviewed.   Ortho Exam  Lumbar spine increased lumbar lordosis with tenderness lower back midline, skin is normal with no congenital lesions no increase in muscle tension on either side pain-free range of motion  Left knee medial joint line tenderness overall alignment looks good no effusion she continues to have excellent range of motion the knee is  stable strength is normal muscle tone is good the skin is intact distally she has no peripheral edema extremities are warm to touch sensation is normal and McMurray sign is negative  The right knee alignment is normal she has full range of motion great strength and stability skin normal pulse and perfusion normal  Arther Abbott, MD  07/29/2017 9:30 AM

## 2017-08-21 ENCOUNTER — Telehealth: Payer: Self-pay | Admitting: Orthopedic Surgery

## 2017-08-21 NOTE — Telephone Encounter (Signed)
Patient called and wanted to know if Dr. Aline Brochure had sent in medication after her June 10th appointment.  I looked at the chart and it appears that he did send this in.  I asked the patient if I could call [facility] on her behalf to see what had happened.  She said this was alright.  I called [facility] and spoke to [place].  What we have found is that there is a discrepancy in the pt's dob 04/25/1950 versus [birth year].  06/05/1950 is the dob that the patient asked for when calling in about her medication and that is why it was not found previously.  They have her dob listed as [birth year], the same as her insurance.  Katie asked that I call the patient back and have Ms. [facility] and ask for her.  She said she will help her get the medication.  I have done this and reminded Ms. Fraleigh that she needs to get with her insurance company and get this matter regarding her birth date corrected as soon as she can.  She said she would do this

## 2017-08-23 ENCOUNTER — Ambulatory Visit (HOSPITAL_COMMUNITY): Payer: BC Managed Care – PPO | Attending: Orthopedic Surgery | Admitting: Physical Therapy

## 2017-08-23 DIAGNOSIS — M6281 Muscle weakness (generalized): Secondary | ICD-10-CM | POA: Insufficient documentation

## 2017-08-23 DIAGNOSIS — M545 Low back pain: Secondary | ICD-10-CM | POA: Insufficient documentation

## 2017-08-23 DIAGNOSIS — G8929 Other chronic pain: Secondary | ICD-10-CM | POA: Insufficient documentation

## 2017-08-30 ENCOUNTER — Encounter (HOSPITAL_COMMUNITY): Payer: Self-pay

## 2017-08-30 ENCOUNTER — Other Ambulatory Visit: Payer: Self-pay

## 2017-08-30 ENCOUNTER — Ambulatory Visit (HOSPITAL_COMMUNITY): Payer: BC Managed Care – PPO

## 2017-08-30 DIAGNOSIS — G8929 Other chronic pain: Secondary | ICD-10-CM | POA: Diagnosis present

## 2017-08-30 DIAGNOSIS — M6281 Muscle weakness (generalized): Secondary | ICD-10-CM

## 2017-08-30 DIAGNOSIS — M545 Low back pain: Secondary | ICD-10-CM

## 2017-08-30 NOTE — Patient Instructions (Signed)
Access Code: [email or web address]  URL: [email or web address]  Date: 08/30/2017  Prepared by: Geraldine Solar   Exercises Supine Double Knee to Chest - 10 reps - 3 sets - 1x daily - 7x weekly Hooklying Hamstring Stretch - 10 reps - 3 sets - 1x daily - 7x weekly Supine Bridge - 10 reps - 3 sets - 1x daily - 7x weekly Supine Active Straight Leg Raise - 10 reps - 3 sets - 1x daily - 7x weekly Sidelying Hip Abduction - 10 reps - 3 sets - 1x daily - 7x weekly Clamshell with Resistance - 10 reps - 3 sets - 1x daily - 7x weekly

## 2017-08-30 NOTE — Therapy (Signed)
[facility] [address] [place], [place], [address] Phone: [phone]   Fax:  [phone]  Physical Therapy Evaluation  Patient Details  Name: [name] MRN: [id number] Date of Birth: [birth year] Referring Provider: Arther Abbott, MD   Encounter Date: 08/30/2017  PT End of Session - 08/30/17 1103    Visit Number  1    Number of Visits  5    Date for PT Re-Evaluation  09/27/17    Authorization Type  [place] Time Period  08/30/17 to 09/27/17    PT Start Time  1027    PT Stop Time  1103    PT Time Calculation (min)  36 min    Activity Tolerance  No increased pain;Patient tolerated treatment well    Behavior During Therapy  [facility] for tasks assessed/performed       Past Medical History:  Diagnosis Date  . Anxiety   . Arthritis    knees, hands  . Chronic gum disease   . Hypertension   . Neuromuscular disorder (HCC)    sciatica  . Restless legs   . SVD (spontaneous vaginal delivery)    x 1    Past Surgical History:  Procedure Laterality Date  . ANTERIOR AND POSTERIOR REPAIR N/A 08/16/2014   Procedure: ANTERIOR (CYSTOCELE) AND POSTERIOR REPAIR (RECTOCELE) ;  Surgeon: Brien Few, MD;  Location: [facility];  Service: Gynecology;  Laterality: N/A;  . BILATERAL SALPINGECTOMY Bilateral 08/16/2014   Procedure: BILATERAL SALPINGECTOMY;  Surgeon: Brien Few, MD;  Location: [facility];  Service: Gynecology;  Laterality: Bilateral;  . BREAST SURGERY     right side I & D  . COLONOSCOPY    . EYE SURGERY     lasik - bilateral  . TONSILLECTOMY    . VAGINAL HYSTERECTOMY N/A 08/16/2014   Procedure: HYSTERECTOMY VAGINAL;  Surgeon: Brien Few, MD;  Location: [facility];  Service: Gynecology;  Laterality: N/A;  2 hrs.    There were no vitals filed for this visit.   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 She states that her  lower back gets tired and fatigues by the end of her 10-hour shift. She reports a hisotry of sciatic nerve issues but denies any issues with that currently, she states that her pain is located at her lower back. No radicular pain or sharp shooting pains down either LE. She also reports some L knee pain which she states she can hear pop when she goes up and down steps. She reports the only aggravating factor to her back pain is her 10-hour work shift and it feeling tired. She reports that rest and a heating pad make her feel better.     Limitations  Walking    How long can you sit comfortably?  no issues    How long can you stand comfortably?  no issues    How long can you walk comfortably?  no issues    Patient Stated Goals  get better    Currently in Pain?  Yes    Pain Score  6     Pain Location  Back    Pain Orientation  Lower    Pain Descriptors / Indicators  Dull;Aching    Pain Type  Chronic pain    Pain Onset  More than a month ago    Pain Frequency  Intermittent    Aggravating Factors   working her 10-hour shift    Pain Relieving Factors  rest, heating pad    Effect of Pain on Daily Activities  slight increase         [facility] PT Assessment - 08/30/17 0001      Assessment   Medical Diagnosis  chronic bil low back pain with sciatica, spondylolisthesis at L5-S1    Referring Provider  Arther Abbott, MD    Onset Date/Surgical Date  -- few months ago it worsened    Next MD Visit  unsure    Prior Therapy  no PT but did have chiro for current issue      Precautions   Precautions  None      Balance Screen   Has the patient fallen in the past 6 months  No    Has the patient had a decrease in activity level because of a fear of falling?   No    Is the patient reluctant to leave their home because of a fear of falling?   No      Prior Function   Level of Independence  Independent    Vocation  Full time employment    Vocation Requirements  custodian - lifting sometimes, pushes  stuff, vacuuming, wiping down counters, etc.    Leisure  go to church      Observation/Other Assessments   Focus on Therapeutic Outcomes (FOTO)   38% limitation      ROM / Strength   AROM / PROM / Strength  AROM;Strength      AROM   AROM Assessment Site  Lumbar    Lumbar Flexion  25% limited, in lordosis    Lumbar Extension  WNL    Lumbar - Right Side Bend  knee joint, stretch on L    Lumbar - Left Side Bend  knee joint, stretch on R    Lumbar - Right Rotation  25% limited    Lumbar - Left Rotation  25% limited      Strength   Strength Assessment Site  Hip;Knee;Ankle    Right Hip Flexion  4/5    Right Hip Extension  4/5    Right Hip ABduction  4/5    Left Hip Flexion  4/5    Left Hip Extension  4/5    Left Hip ABduction  4/5    Right Knee Flexion  4+/5    Right Knee Extension  5/5    Left Knee Flexion  4+/5    Left Knee Extension  5/5    Right Ankle Dorsiflexion  5/5    Left Ankle Dorsiflexion  5/5      Flexibility   Soft Tissue Assessment /Muscle Length  yes    Hamstrings  WNL, SLR test neg bil      Palpation   Spinal mobility  hypomobile    Palpation comment  mild soft tissue restrictions lumbar paraspinals and glutes, non-tender      Ambulation/Gait   Stairs  Yes    Stairs Assistance  6: Modified independent (Device/Increase time)    Number of Stairs  8    Height of Stairs  7    Gait Comments  stairs assessment: noted to have bil hip IR/knee valgus      Balance   Balance Assessed  Yes      Static Standing Balance   Static Standing - Balance Support  No upper extremity supported    Static Standing Balance -  Activities   Single Leg Stance - Right Leg;Single Leg Stance - Left Leg    Static Standing - Comment/# of Minutes  R: 17sec L: 3.6 sec or <, tberg           Objective measurements completed on examination: See above findings.         PT Education - 08/30/17 1057    Education Details  exam findings, POC, HEP    Person(s) Educated  Patient     Methods  Explanation;Demonstration;Handout    Comprehension  Verbalized understanding;Returned demonstration       PT Short Term Goals - 08/30/17 1109      PT SHORT TERM GOAL #1   Title  Pt will be independent with HEP and perform consistently in order to maximize core strength and decrease pain.    Time  2    Period  Weeks    Status  New    Target Date  09/13/17      PT SHORT TERM GOAL #2   Title  Pt will be able to perform L SLS for 10 sec or > to demo improved core and hip strength.    Time  2    Period  Weeks    Status  New        PT Long Term Goals - 08/30/17 1109      PT LONG TERM GOAL #1   Title  Pt will have 4+/5 or > throughout proximal hip strength in order to decrease LBP and maximize stair ambulation at work.    Time  4    Period  Weeks    Status  New    Target Date  09/27/17      PT LONG TERM GOAL #2   Title  Pt will report min to no LBP following a 10-hour work shift to demo improved strength and tolerance to functional activities.    Time  4    Period  Weeks    Status  New      PT LONG TERM GOAL #3   Title  Pt will be able to ambulate full flight of stairs with proper mechanics, min to no knee valgus, to demo improved core and hip strength in order to maximize function at work.    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 Pt currently presents with deficits in core strength, proximal hip strength, and balance. She had minor soft tissue restrictions and limitations in ROM, but palpation throughout and ROM assessment did not recreate or increase her pain. PT feels pt's c/c is due to deficits in transverse abdominis engagement/core strength and feel that she would benefit from brief skilled PT intervention to provide her with a core and hip strengthening HEP in order to  maximize her function at work and decrease her pain.     Clinical Presentation  Stable    Clinical Presentation due to:  core strength, hip strength, stair ambulation, balance    Clinical Decision Making  Low    Rehab Potential  Excellent    PT Frequency  1x / week    PT Duration  4 weeks    PT Treatment/Interventions  ADLs/Self Care Home Management;Cryotherapy;Dealer  Stimulation;Moist Heat;Ultrasound;Gait training;Stair training;Functional mobility training;Therapeutic activities;Therapeutic exercise;Balance training;Neuromuscular re-education;Patient/family education;Manual techniques;Passive range of motion;Dry needling;Taping    PT Next Visit Plan  review goals, ensure compliance with HEP; HEP should be updated each visit; continue to progress core and hip strength and balance;     PT Home Exercise Plan  eval: SKTC, supine HS stretch, bridging, SLR, sidelying hip abd, sidelying clams with GTB,     Consulted and Agree with Plan of Care  Patient       Patient will benefit from skilled therapeutic intervention in order to improve the following deficits and impairments:  Decreased balance, Decreased strength, Hypomobility, Pain, Improper body mechanics  Visit Diagnosis: Muscle weakness (generalized) - Plan: PT plan of care cert/re-cert  Chronic midline low back pain without sciatica - Plan: PT plan of care cert/re-cert     Problem List Patient Active Problem List   Diagnosis Date Noted  . 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 OSTEOARTHRITIS, CARPOMETACARPAL JOINT, RIGHT THUMB 02/03/2007  . KNEE PAIN 02/03/2007  . BACK PAIN 02/03/2007        [name] PT, DPT  [facility] [address] [place], [place], [address] Phone: [phone]   Fax:  [phone]  Name: Ronnell Makarewicz MRN: [id number] Date of Birth: 1950-04-10

## 2018-01-29 ENCOUNTER — Ambulatory Visit: Payer: BC Managed Care – PPO | Admitting: Orthopedic Surgery

## 2018-10-14 ENCOUNTER — Other Ambulatory Visit: Payer: Self-pay | Admitting: Obstetrics and Gynecology

## 2018-10-14 DIAGNOSIS — Z1231 Encounter for screening mammogram for malignant neoplasm of breast: Secondary | ICD-10-CM

## 2018-11-26 ENCOUNTER — Ambulatory Visit
Admission: RE | Admit: 2018-11-26 | Discharge: 2018-11-26 | Disposition: A | Discharge status: Home / Self Care | Payer: BC Managed Care – PPO | Source: Ambulatory Visit | Attending: Obstetrics and Gynecology | Admitting: Obstetrics and Gynecology

## 2018-11-26 ENCOUNTER — Other Ambulatory Visit: Payer: Self-pay

## 2018-11-26 DIAGNOSIS — Z1231 Encounter for screening mammogram for malignant neoplasm of breast: Secondary | ICD-10-CM

## 2019-04-26 ENCOUNTER — Ambulatory Visit: Payer: BC Managed Care – PPO

## 2019-10-02 ENCOUNTER — Other Ambulatory Visit: Payer: Self-pay | Admitting: Obstetrics and Gynecology

## 2019-10-02 DIAGNOSIS — Z1231 Encounter for screening mammogram for malignant neoplasm of breast: Secondary | ICD-10-CM

## 2019-11-27 ENCOUNTER — Ambulatory Visit: Payer: BC Managed Care – PPO

## 2019-12-21 ENCOUNTER — Ambulatory Visit: Payer: BC Managed Care – PPO

## 2019-12-31 ENCOUNTER — Other Ambulatory Visit: Payer: Self-pay

## 2019-12-31 ENCOUNTER — Ambulatory Visit
Admission: RE | Admit: 2019-12-31 | Discharge: 2019-12-31 | Disposition: A | Discharge status: Home / Self Care | Payer: BC Managed Care – PPO | Source: Ambulatory Visit | Attending: Obstetrics and Gynecology | Admitting: Obstetrics and Gynecology

## 2019-12-31 DIAGNOSIS — Z1231 Encounter for screening mammogram for malignant neoplasm of breast: Secondary | ICD-10-CM

## 2020-11-22 ENCOUNTER — Other Ambulatory Visit: Payer: Self-pay | Admitting: Obstetrics and Gynecology

## 2020-11-22 DIAGNOSIS — Z1231 Encounter for screening mammogram for malignant neoplasm of breast: Secondary | ICD-10-CM

## 2020-12-13 DIAGNOSIS — Z124 Encounter for screening for malignant neoplasm of cervix: Secondary | ICD-10-CM | POA: Diagnosis not present

## 2020-12-13 DIAGNOSIS — Z01411 Encounter for gynecological examination (general) (routine) with abnormal findings: Secondary | ICD-10-CM | POA: Diagnosis not present

## 2020-12-13 DIAGNOSIS — Z6831 Body mass index (BMI) 31.0-31.9, adult: Secondary | ICD-10-CM | POA: Diagnosis not present

## 2020-12-13 DIAGNOSIS — Z01419 Encounter for gynecological examination (general) (routine) without abnormal findings: Secondary | ICD-10-CM | POA: Diagnosis not present

## 2020-12-13 DIAGNOSIS — Z90711 Acquired absence of uterus with remaining cervical stump: Secondary | ICD-10-CM | POA: Diagnosis not present

## 2020-12-13 DIAGNOSIS — N951 Menopausal and female climacteric states: Secondary | ICD-10-CM | POA: Diagnosis not present

## 2020-12-16 DIAGNOSIS — Z23 Encounter for immunization: Secondary | ICD-10-CM | POA: Diagnosis not present

## 2020-12-21 ENCOUNTER — Other Ambulatory Visit: Payer: Self-pay | Admitting: Obstetrics and Gynecology

## 2020-12-21 DIAGNOSIS — E2839 Other primary ovarian failure: Secondary | ICD-10-CM

## 2020-12-28 DIAGNOSIS — I10 Essential (primary) hypertension: Secondary | ICD-10-CM | POA: Diagnosis not present

## 2020-12-28 DIAGNOSIS — F411 Generalized anxiety disorder: Secondary | ICD-10-CM | POA: Diagnosis not present

## 2020-12-28 DIAGNOSIS — Z0000 Encounter for general adult medical examination without abnormal findings: Secondary | ICD-10-CM | POA: Diagnosis not present

## 2020-12-28 DIAGNOSIS — Z6829 Body mass index (BMI) 29.0-29.9, adult: Secondary | ICD-10-CM | POA: Diagnosis not present

## 2020-12-28 DIAGNOSIS — N182 Chronic kidney disease, stage 2 (mild): Secondary | ICD-10-CM | POA: Diagnosis not present

## 2020-12-28 DIAGNOSIS — L723 Sebaceous cyst: Secondary | ICD-10-CM | POA: Diagnosis not present

## 2020-12-30 DIAGNOSIS — N182 Chronic kidney disease, stage 2 (mild): Secondary | ICD-10-CM | POA: Diagnosis not present

## 2020-12-30 DIAGNOSIS — I10 Essential (primary) hypertension: Secondary | ICD-10-CM | POA: Diagnosis not present

## 2020-12-30 DIAGNOSIS — F411 Generalized anxiety disorder: Secondary | ICD-10-CM | POA: Diagnosis not present

## 2020-12-30 DIAGNOSIS — L723 Sebaceous cyst: Secondary | ICD-10-CM | POA: Diagnosis not present

## 2020-12-30 DIAGNOSIS — Z0000 Encounter for general adult medical examination without abnormal findings: Secondary | ICD-10-CM | POA: Diagnosis not present

## 2021-01-02 ENCOUNTER — Ambulatory Visit
Admission: RE | Admit: 2021-01-02 | Discharge: 2021-01-02 | Disposition: A | Discharge status: Home / Self Care | Payer: Medicare PPO | Source: Ambulatory Visit | Attending: Obstetrics and Gynecology | Admitting: Obstetrics and Gynecology

## 2021-01-02 ENCOUNTER — Other Ambulatory Visit: Payer: Self-pay

## 2021-01-02 DIAGNOSIS — Z1231 Encounter for screening mammogram for malignant neoplasm of breast: Secondary | ICD-10-CM

## 2021-02-09 ENCOUNTER — Other Ambulatory Visit: Payer: Medicare PPO

## 2021-03-30 DIAGNOSIS — Z0000 Encounter for general adult medical examination without abnormal findings: Secondary | ICD-10-CM | POA: Diagnosis not present

## 2021-03-30 DIAGNOSIS — F411 Generalized anxiety disorder: Secondary | ICD-10-CM | POA: Diagnosis not present

## 2021-03-30 DIAGNOSIS — N182 Chronic kidney disease, stage 2 (mild): Secondary | ICD-10-CM | POA: Diagnosis not present

## 2021-03-30 DIAGNOSIS — Z6829 Body mass index (BMI) 29.0-29.9, adult: Secondary | ICD-10-CM | POA: Diagnosis not present

## 2021-03-30 DIAGNOSIS — I10 Essential (primary) hypertension: Secondary | ICD-10-CM | POA: Diagnosis not present

## 2021-05-15 DIAGNOSIS — H52203 Unspecified astigmatism, bilateral: Secondary | ICD-10-CM | POA: Diagnosis not present

## 2021-05-15 DIAGNOSIS — H25813 Combined forms of age-related cataract, bilateral: Secondary | ICD-10-CM | POA: Diagnosis not present

## 2021-05-15 DIAGNOSIS — H524 Presbyopia: Secondary | ICD-10-CM | POA: Diagnosis not present

## 2021-05-15 DIAGNOSIS — H5203 Hypermetropia, bilateral: Secondary | ICD-10-CM | POA: Diagnosis not present

## 2021-06-29 DIAGNOSIS — F411 Generalized anxiety disorder: Secondary | ICD-10-CM | POA: Diagnosis not present

## 2021-06-29 DIAGNOSIS — Z0000 Encounter for general adult medical examination without abnormal findings: Secondary | ICD-10-CM | POA: Diagnosis not present

## 2021-06-29 DIAGNOSIS — N182 Chronic kidney disease, stage 2 (mild): Secondary | ICD-10-CM | POA: Diagnosis not present

## 2021-06-29 DIAGNOSIS — I10 Essential (primary) hypertension: Secondary | ICD-10-CM | POA: Diagnosis not present

## 2021-06-29 DIAGNOSIS — Z6831 Body mass index (BMI) 31.0-31.9, adult: Secondary | ICD-10-CM | POA: Diagnosis not present

## 2021-06-30 DIAGNOSIS — Z0000 Encounter for general adult medical examination without abnormal findings: Secondary | ICD-10-CM | POA: Diagnosis not present

## 2021-06-30 DIAGNOSIS — N182 Chronic kidney disease, stage 2 (mild): Secondary | ICD-10-CM | POA: Diagnosis not present

## 2021-06-30 DIAGNOSIS — R7303 Prediabetes: Secondary | ICD-10-CM | POA: Diagnosis not present

## 2021-06-30 DIAGNOSIS — I10 Essential (primary) hypertension: Secondary | ICD-10-CM | POA: Diagnosis not present

## 2021-06-30 DIAGNOSIS — F411 Generalized anxiety disorder: Secondary | ICD-10-CM | POA: Diagnosis not present

## 2021-07-06 DIAGNOSIS — N951 Menopausal and female climacteric states: Secondary | ICD-10-CM | POA: Diagnosis not present

## 2021-07-06 DIAGNOSIS — E2839 Other primary ovarian failure: Secondary | ICD-10-CM | POA: Diagnosis not present

## 2021-07-06 DIAGNOSIS — Z780 Asymptomatic menopausal state: Secondary | ICD-10-CM | POA: Diagnosis not present

## 2021-08-02 DIAGNOSIS — K219 Gastro-esophageal reflux disease without esophagitis: Secondary | ICD-10-CM | POA: Diagnosis not present

## 2021-08-02 DIAGNOSIS — I209 Angina pectoris, unspecified: Secondary | ICD-10-CM | POA: Diagnosis not present

## 2021-08-02 DIAGNOSIS — Z6829 Body mass index (BMI) 29.0-29.9, adult: Secondary | ICD-10-CM | POA: Diagnosis not present

## 2021-08-28 DIAGNOSIS — I209 Angina pectoris, unspecified: Secondary | ICD-10-CM | POA: Diagnosis not present

## 2021-10-03 DIAGNOSIS — F411 Generalized anxiety disorder: Secondary | ICD-10-CM | POA: Diagnosis not present

## 2021-10-03 DIAGNOSIS — Z6830 Body mass index (BMI) 30.0-30.9, adult: Secondary | ICD-10-CM | POA: Diagnosis not present

## 2021-10-03 DIAGNOSIS — I10 Essential (primary) hypertension: Secondary | ICD-10-CM | POA: Diagnosis not present

## 2021-10-03 DIAGNOSIS — N182 Chronic kidney disease, stage 2 (mild): Secondary | ICD-10-CM | POA: Diagnosis not present

## 2021-10-03 DIAGNOSIS — K219 Gastro-esophageal reflux disease without esophagitis: Secondary | ICD-10-CM | POA: Diagnosis not present

## 2021-11-20 DIAGNOSIS — H04123 Dry eye syndrome of bilateral lacrimal glands: Secondary | ICD-10-CM | POA: Diagnosis not present

## 2021-12-07 ENCOUNTER — Other Ambulatory Visit: Payer: Self-pay | Admitting: Obstetrics and Gynecology

## 2021-12-07 DIAGNOSIS — Z1231 Encounter for screening mammogram for malignant neoplasm of breast: Secondary | ICD-10-CM

## 2021-12-18 DIAGNOSIS — Z01419 Encounter for gynecological examination (general) (routine) without abnormal findings: Secondary | ICD-10-CM | POA: Diagnosis not present

## 2021-12-18 DIAGNOSIS — Z0000 Encounter for general adult medical examination without abnormal findings: Secondary | ICD-10-CM | POA: Diagnosis not present

## 2021-12-18 DIAGNOSIS — Z124 Encounter for screening for malignant neoplasm of cervix: Secondary | ICD-10-CM | POA: Diagnosis not present

## 2021-12-18 DIAGNOSIS — Z01411 Encounter for gynecological examination (general) (routine) with abnormal findings: Secondary | ICD-10-CM | POA: Diagnosis not present

## 2021-12-18 DIAGNOSIS — Z90711 Acquired absence of uterus with remaining cervical stump: Secondary | ICD-10-CM | POA: Diagnosis not present

## 2021-12-18 DIAGNOSIS — Z6831 Body mass index (BMI) 31.0-31.9, adult: Secondary | ICD-10-CM | POA: Diagnosis not present

## 2021-12-28 DIAGNOSIS — Z23 Encounter for immunization: Secondary | ICD-10-CM | POA: Diagnosis not present

## 2022-01-17 DIAGNOSIS — N182 Chronic kidney disease, stage 2 (mild): Secondary | ICD-10-CM | POA: Diagnosis not present

## 2022-01-17 DIAGNOSIS — I10 Essential (primary) hypertension: Secondary | ICD-10-CM | POA: Diagnosis not present

## 2022-01-17 DIAGNOSIS — F411 Generalized anxiety disorder: Secondary | ICD-10-CM | POA: Diagnosis not present

## 2022-01-17 DIAGNOSIS — Z6830 Body mass index (BMI) 30.0-30.9, adult: Secondary | ICD-10-CM | POA: Diagnosis not present

## 2022-01-17 DIAGNOSIS — K219 Gastro-esophageal reflux disease without esophagitis: Secondary | ICD-10-CM | POA: Diagnosis not present

## 2022-01-19 DIAGNOSIS — E782 Mixed hyperlipidemia: Secondary | ICD-10-CM | POA: Diagnosis not present

## 2022-01-19 DIAGNOSIS — I10 Essential (primary) hypertension: Secondary | ICD-10-CM | POA: Diagnosis not present

## 2022-02-14 ENCOUNTER — Ambulatory Visit
Admission: RE | Admit: 2022-02-14 | Discharge: 2022-02-14 | Disposition: A | Discharge status: Home / Self Care | Payer: Medicare PPO | Source: Ambulatory Visit | Attending: Obstetrics and Gynecology | Admitting: Obstetrics and Gynecology

## 2022-02-14 DIAGNOSIS — Z1231 Encounter for screening mammogram for malignant neoplasm of breast: Secondary | ICD-10-CM

## 2022-04-10 DIAGNOSIS — Z6830 Body mass index (BMI) 30.0-30.9, adult: Secondary | ICD-10-CM | POA: Diagnosis not present

## 2022-04-10 DIAGNOSIS — F411 Generalized anxiety disorder: Secondary | ICD-10-CM | POA: Diagnosis not present

## 2022-04-10 DIAGNOSIS — I10 Essential (primary) hypertension: Secondary | ICD-10-CM | POA: Diagnosis not present

## 2022-04-10 DIAGNOSIS — N182 Chronic kidney disease, stage 2 (mild): Secondary | ICD-10-CM | POA: Diagnosis not present

## 2022-04-10 DIAGNOSIS — K219 Gastro-esophageal reflux disease without esophagitis: Secondary | ICD-10-CM | POA: Diagnosis not present

## 2022-05-10 DIAGNOSIS — I201 Angina pectoris with documented spasm: Secondary | ICD-10-CM | POA: Diagnosis not present

## 2022-05-10 DIAGNOSIS — Z6830 Body mass index (BMI) 30.0-30.9, adult: Secondary | ICD-10-CM | POA: Diagnosis not present

## 2022-05-16 DIAGNOSIS — R0602 Shortness of breath: Secondary | ICD-10-CM | POA: Diagnosis not present

## 2022-05-16 DIAGNOSIS — R0789 Other chest pain: Secondary | ICD-10-CM | POA: Diagnosis not present

## 2022-07-17 DIAGNOSIS — Z0000 Encounter for general adult medical examination without abnormal findings: Secondary | ICD-10-CM | POA: Diagnosis not present

## 2022-07-17 DIAGNOSIS — I10 Essential (primary) hypertension: Secondary | ICD-10-CM | POA: Diagnosis not present

## 2022-07-17 DIAGNOSIS — Z6828 Body mass index (BMI) 28.0-28.9, adult: Secondary | ICD-10-CM | POA: Diagnosis not present

## 2022-07-17 DIAGNOSIS — K219 Gastro-esophageal reflux disease without esophagitis: Secondary | ICD-10-CM | POA: Diagnosis not present

## 2022-07-17 DIAGNOSIS — F411 Generalized anxiety disorder: Secondary | ICD-10-CM | POA: Diagnosis not present

## 2022-07-17 DIAGNOSIS — I201 Angina pectoris with documented spasm: Secondary | ICD-10-CM | POA: Diagnosis not present

## 2022-07-17 DIAGNOSIS — J309 Allergic rhinitis, unspecified: Secondary | ICD-10-CM | POA: Diagnosis not present

## 2022-07-17 DIAGNOSIS — N182 Chronic kidney disease, stage 2 (mild): Secondary | ICD-10-CM | POA: Diagnosis not present

## 2022-10-17 DIAGNOSIS — Z6826 Body mass index (BMI) 26.0-26.9, adult: Secondary | ICD-10-CM | POA: Diagnosis not present

## 2022-10-17 DIAGNOSIS — J309 Allergic rhinitis, unspecified: Secondary | ICD-10-CM | POA: Diagnosis not present

## 2022-10-17 DIAGNOSIS — N184 Chronic kidney disease, stage 4 (severe): Secondary | ICD-10-CM | POA: Diagnosis not present

## 2022-10-17 DIAGNOSIS — F411 Generalized anxiety disorder: Secondary | ICD-10-CM | POA: Diagnosis not present

## 2022-10-17 DIAGNOSIS — K219 Gastro-esophageal reflux disease without esophagitis: Secondary | ICD-10-CM | POA: Diagnosis not present

## 2022-10-17 DIAGNOSIS — I10 Essential (primary) hypertension: Secondary | ICD-10-CM | POA: Diagnosis not present

## 2022-10-17 DIAGNOSIS — Z0000 Encounter for general adult medical examination without abnormal findings: Secondary | ICD-10-CM | POA: Diagnosis not present

## 2022-10-19 DIAGNOSIS — J309 Allergic rhinitis, unspecified: Secondary | ICD-10-CM | POA: Diagnosis not present

## 2022-10-19 DIAGNOSIS — K219 Gastro-esophageal reflux disease without esophagitis: Secondary | ICD-10-CM | POA: Diagnosis not present

## 2022-10-19 DIAGNOSIS — N184 Chronic kidney disease, stage 4 (severe): Secondary | ICD-10-CM | POA: Diagnosis not present

## 2022-10-19 DIAGNOSIS — I10 Essential (primary) hypertension: Secondary | ICD-10-CM | POA: Diagnosis not present

## 2022-10-19 DIAGNOSIS — F411 Generalized anxiety disorder: Secondary | ICD-10-CM | POA: Diagnosis not present

## 2022-10-19 DIAGNOSIS — Z0000 Encounter for general adult medical examination without abnormal findings: Secondary | ICD-10-CM | POA: Diagnosis not present

## 2022-10-23 ENCOUNTER — Encounter (INDEPENDENT_AMBULATORY_CARE_PROVIDER_SITE_OTHER): Payer: Self-pay | Admitting: *Deleted

## 2022-11-26 ENCOUNTER — Other Ambulatory Visit: Payer: Self-pay | Admitting: Obstetrics and Gynecology

## 2022-11-26 DIAGNOSIS — Z1231 Encounter for screening mammogram for malignant neoplasm of breast: Secondary | ICD-10-CM

## 2022-12-15 DIAGNOSIS — H40033 Anatomical narrow angle, bilateral: Secondary | ICD-10-CM | POA: Diagnosis not present

## 2022-12-15 DIAGNOSIS — H2513 Age-related nuclear cataract, bilateral: Secondary | ICD-10-CM | POA: Diagnosis not present

## 2023-01-01 DIAGNOSIS — Z124 Encounter for screening for malignant neoplasm of cervix: Secondary | ICD-10-CM | POA: Diagnosis not present

## 2023-01-23 DIAGNOSIS — I10 Essential (primary) hypertension: Secondary | ICD-10-CM | POA: Diagnosis not present

## 2023-02-18 ENCOUNTER — Ambulatory Visit
Admission: RE | Admit: 2023-02-18 | Discharge: 2023-02-18 | Disposition: A | Discharge status: Home / Self Care | Payer: Medicare PPO | Source: Ambulatory Visit | Attending: Obstetrics and Gynecology | Admitting: Obstetrics and Gynecology

## 2023-02-18 DIAGNOSIS — Z1231 Encounter for screening mammogram for malignant neoplasm of breast: Secondary | ICD-10-CM | POA: Diagnosis not present

## 2023-04-23 ENCOUNTER — Encounter (INDEPENDENT_AMBULATORY_CARE_PROVIDER_SITE_OTHER): Payer: Self-pay | Admitting: *Deleted

## 2023-04-24 DIAGNOSIS — Z0000 Encounter for general adult medical examination without abnormal findings: Secondary | ICD-10-CM | POA: Diagnosis not present

## 2023-04-24 DIAGNOSIS — F411 Generalized anxiety disorder: Secondary | ICD-10-CM | POA: Diagnosis not present

## 2023-04-24 DIAGNOSIS — J309 Allergic rhinitis, unspecified: Secondary | ICD-10-CM | POA: Diagnosis not present

## 2023-04-24 DIAGNOSIS — K219 Gastro-esophageal reflux disease without esophagitis: Secondary | ICD-10-CM | POA: Diagnosis not present

## 2023-04-24 DIAGNOSIS — Z6825 Body mass index (BMI) 25.0-25.9, adult: Secondary | ICD-10-CM | POA: Diagnosis not present

## 2023-04-24 DIAGNOSIS — I10 Essential (primary) hypertension: Secondary | ICD-10-CM | POA: Diagnosis not present

## 2023-04-24 DIAGNOSIS — N1831 Chronic kidney disease, stage 3a: Secondary | ICD-10-CM | POA: Diagnosis not present

## 2023-06-21 IMAGING — MG MM DIGITAL SCREENING BILAT W/ TOMO AND CAD
8 series · 8 of 24 positions shown · non-contrast
Comparison: Previous exam(s).

CLINICAL DATA: Screening.

EXAM:
DIGITAL SCREENING BILATERAL MAMMOGRAM WITH TOMOSYNTHESIS AND CAD
TECHNIQUE: Bilateral screening digital craniocaudal and mediolateral oblique
mammograms were obtained. Bilateral screening digital breast
tomosynthesis was performed. The images were evaluated with
computer-aided detection.

[L CC synth-2D]
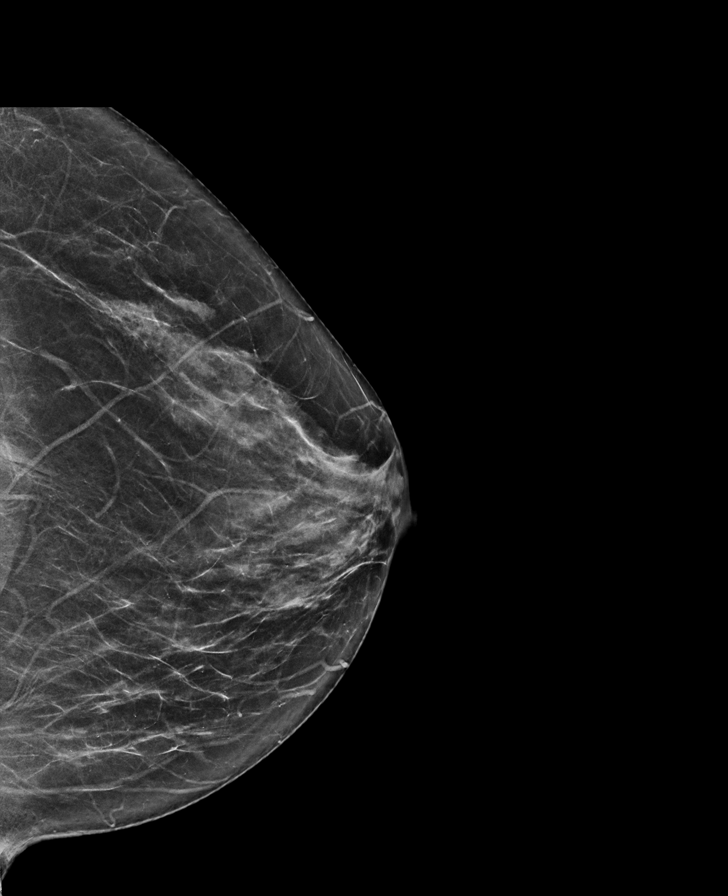

[L MLO synth-2D]
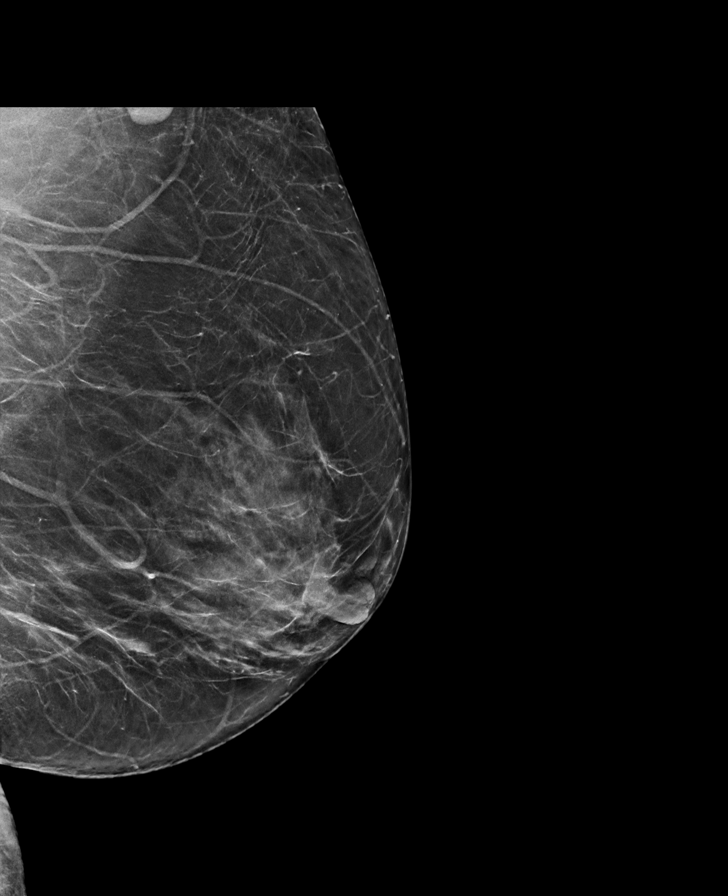

[R CC synth-2D]
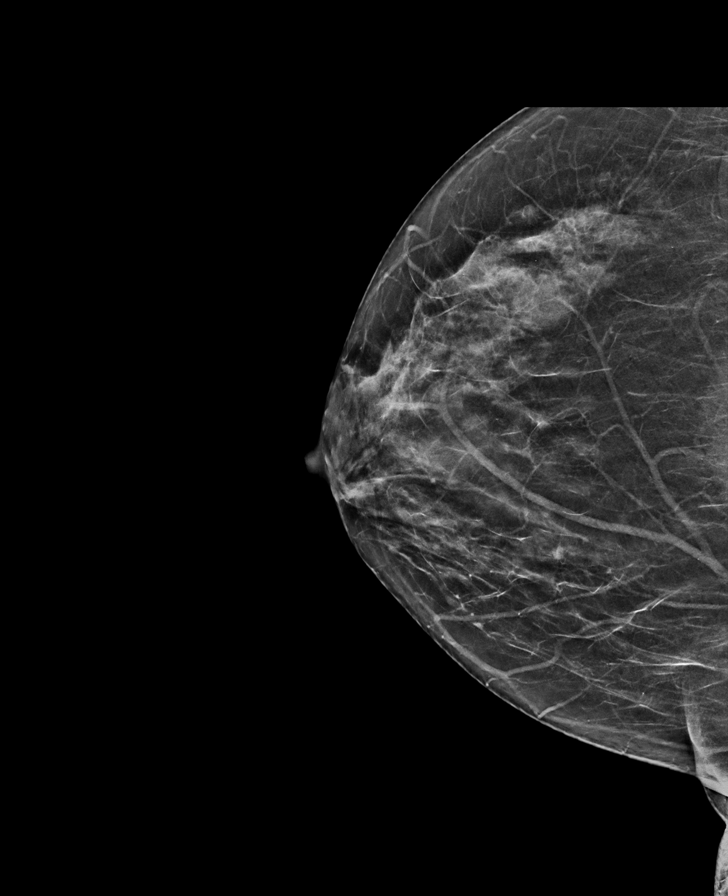

[R MLO synth-2D]
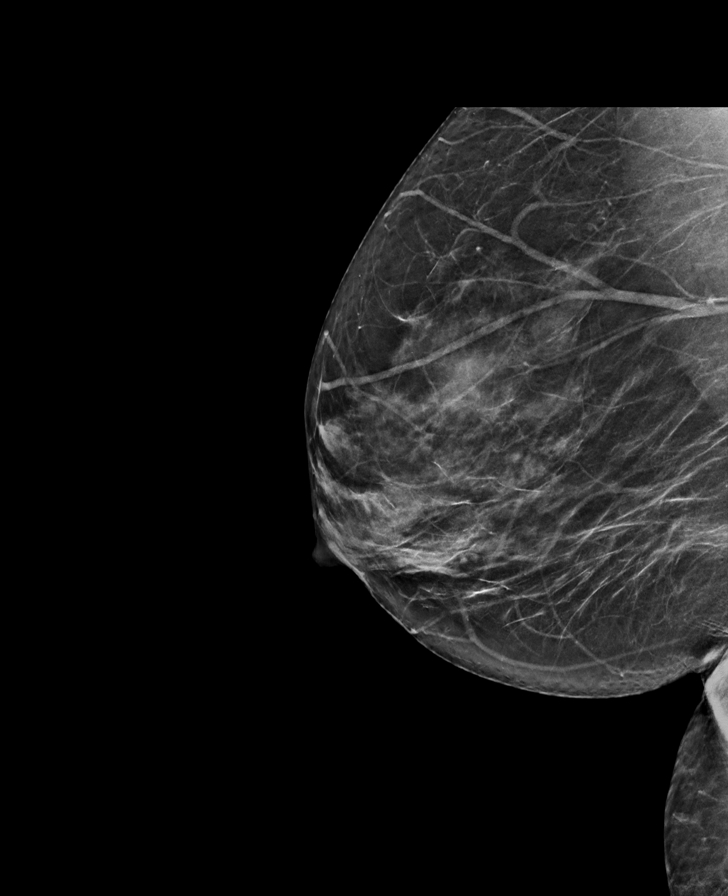

[L MLO tomo · tomo slice 35/69.0]
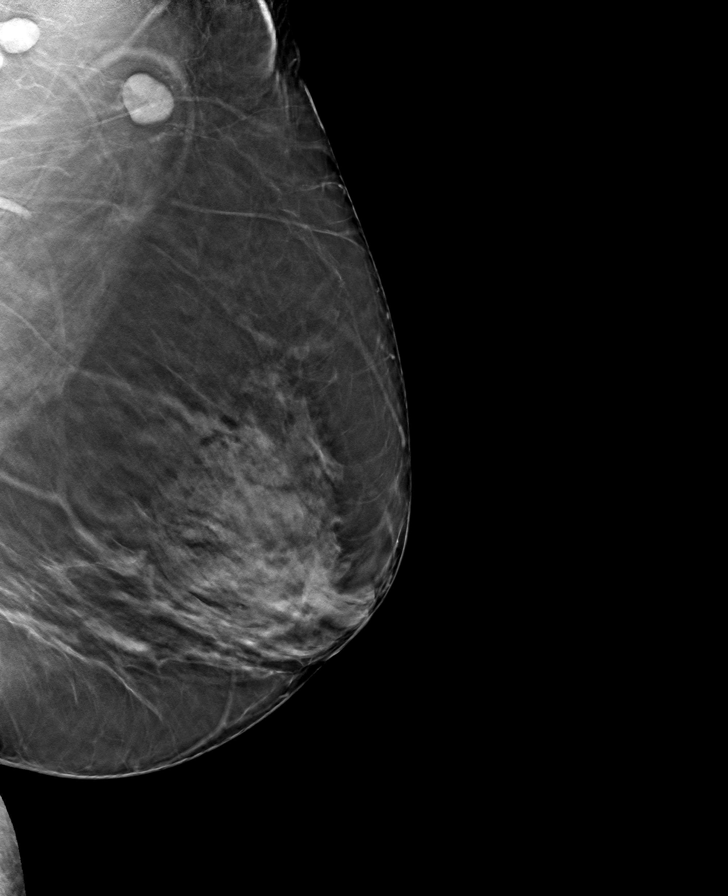

[R CC tomo · tomo slice 29/58.0]
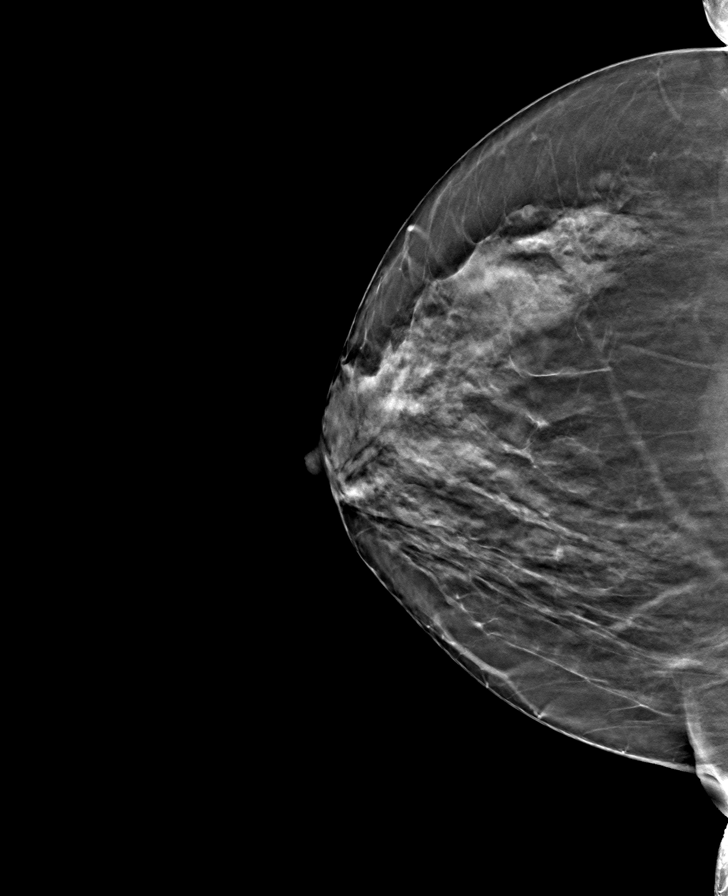

[R MLO tomo · tomo slice 32/63.0]
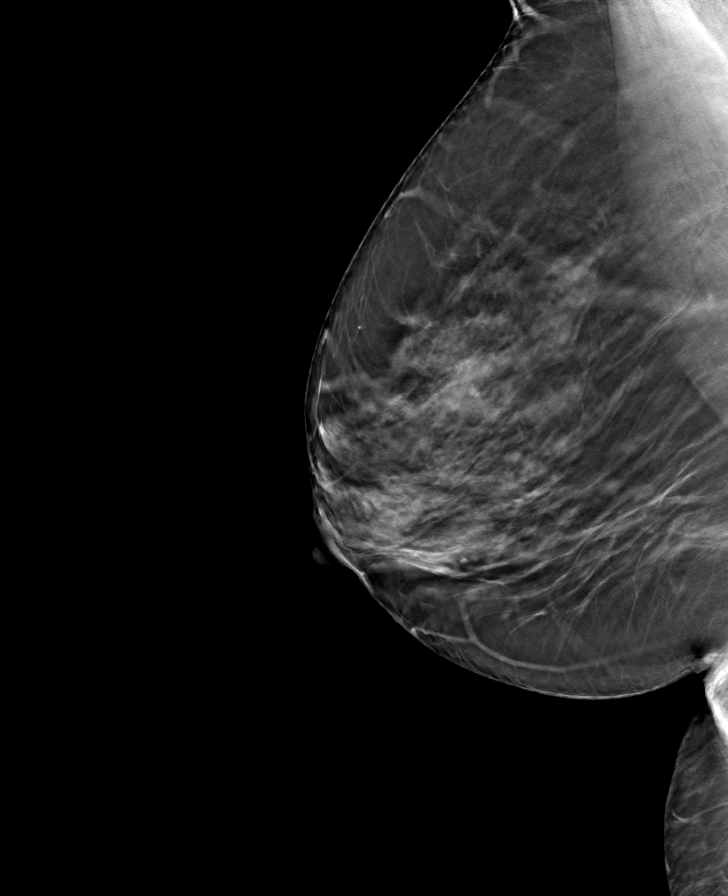

[L CC tomo · tomo slice 31/62.0]
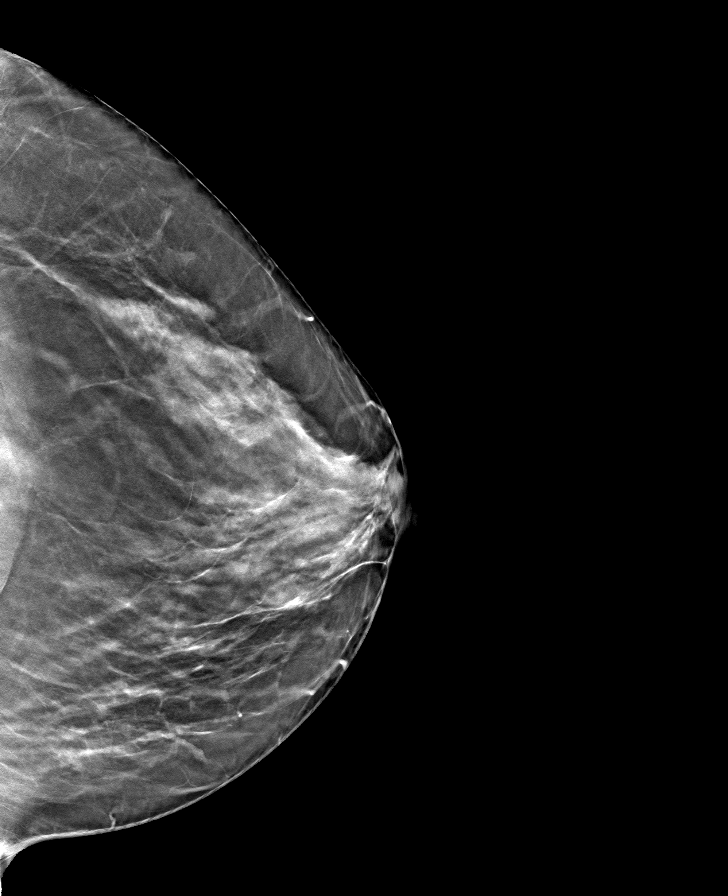

[8 of 24 positions shown; findings below may reference images not displayed]

ACR Breast Density Category b: There are scattered areas of
fibroglandular density.
FINDINGS: There are no findings suspicious for malignancy.
IMPRESSION: No mammographic evidence of malignancy. A result letter of this
screening mammogram will be mailed directly to the patient.

RECOMMENDATION:
Screening mammogram in one year. (Code:51-O-LD2)

BI-RADS CATEGORY  1: Negative.

## 2023-07-31 DIAGNOSIS — J309 Allergic rhinitis, unspecified: Secondary | ICD-10-CM | POA: Diagnosis not present

## 2023-07-31 DIAGNOSIS — M5418 Radiculopathy, sacral and sacrococcygeal region: Secondary | ICD-10-CM | POA: Diagnosis not present

## 2023-07-31 DIAGNOSIS — Z0000 Encounter for general adult medical examination without abnormal findings: Secondary | ICD-10-CM | POA: Diagnosis not present

## 2023-07-31 DIAGNOSIS — I10 Essential (primary) hypertension: Secondary | ICD-10-CM | POA: Diagnosis not present

## 2023-07-31 DIAGNOSIS — F411 Generalized anxiety disorder: Secondary | ICD-10-CM | POA: Diagnosis not present

## 2023-07-31 DIAGNOSIS — K219 Gastro-esophageal reflux disease without esophagitis: Secondary | ICD-10-CM | POA: Diagnosis not present

## 2023-07-31 DIAGNOSIS — Z6826 Body mass index (BMI) 26.0-26.9, adult: Secondary | ICD-10-CM | POA: Diagnosis not present

## 2023-07-31 DIAGNOSIS — N1831 Chronic kidney disease, stage 3a: Secondary | ICD-10-CM | POA: Diagnosis not present

## 2023-08-27 DIAGNOSIS — M6281 Muscle weakness (generalized): Secondary | ICD-10-CM | POA: Diagnosis not present

## 2023-08-27 DIAGNOSIS — M25561 Pain in right knee: Secondary | ICD-10-CM | POA: Diagnosis not present

## 2023-08-29 DIAGNOSIS — M25561 Pain in right knee: Secondary | ICD-10-CM | POA: Diagnosis not present

## 2023-08-29 DIAGNOSIS — M6281 Muscle weakness (generalized): Secondary | ICD-10-CM | POA: Diagnosis not present

## 2023-09-03 DIAGNOSIS — M6281 Muscle weakness (generalized): Secondary | ICD-10-CM | POA: Diagnosis not present

## 2023-09-03 DIAGNOSIS — M25561 Pain in right knee: Secondary | ICD-10-CM | POA: Diagnosis not present

## 2023-11-13 DIAGNOSIS — K219 Gastro-esophageal reflux disease without esophagitis: Secondary | ICD-10-CM | POA: Diagnosis not present

## 2023-11-13 DIAGNOSIS — F411 Generalized anxiety disorder: Secondary | ICD-10-CM | POA: Diagnosis not present

## 2023-11-13 DIAGNOSIS — Z6826 Body mass index (BMI) 26.0-26.9, adult: Secondary | ICD-10-CM | POA: Diagnosis not present

## 2023-11-13 DIAGNOSIS — J309 Allergic rhinitis, unspecified: Secondary | ICD-10-CM | POA: Diagnosis not present

## 2023-11-13 DIAGNOSIS — I10 Essential (primary) hypertension: Secondary | ICD-10-CM | POA: Diagnosis not present

## 2023-11-13 DIAGNOSIS — N1831 Chronic kidney disease, stage 3a: Secondary | ICD-10-CM | POA: Diagnosis not present

## 2023-11-13 DIAGNOSIS — M5418 Radiculopathy, sacral and sacrococcygeal region: Secondary | ICD-10-CM | POA: Diagnosis not present

## 2023-11-15 DIAGNOSIS — M5418 Radiculopathy, sacral and sacrococcygeal region: Secondary | ICD-10-CM | POA: Diagnosis not present

## 2023-11-15 DIAGNOSIS — I10 Essential (primary) hypertension: Secondary | ICD-10-CM | POA: Diagnosis not present

## 2023-11-15 DIAGNOSIS — N1831 Chronic kidney disease, stage 3a: Secondary | ICD-10-CM | POA: Diagnosis not present

## 2024-01-21 ENCOUNTER — Other Ambulatory Visit: Payer: Self-pay | Admitting: Internal Medicine

## 2024-01-21 DIAGNOSIS — Z1231 Encounter for screening mammogram for malignant neoplasm of breast: Secondary | ICD-10-CM

## 2024-02-19 ENCOUNTER — Ambulatory Visit
Admission: RE | Admit: 2024-02-19 | Discharge: 2024-02-19 | Disposition: A | Discharge status: Home / Self Care | Source: Ambulatory Visit | Attending: Internal Medicine | Admitting: Internal Medicine

## 2024-02-19 DIAGNOSIS — Z1231 Encounter for screening mammogram for malignant neoplasm of breast: Secondary | ICD-10-CM
